# Patient Record
Sex: Female | Born: 1955 | Race: White | Hispanic: No | Marital: Married | Smoking: Never smoker
Health system: Southern US, Community
[De-identification: ages and names within clinical notes are randomized; demographics above are authoritative.]

## PROBLEM LIST (undated history)

## (undated) DIAGNOSIS — C9110 Chronic lymphocytic leukemia of B-cell type not having achieved remission: Secondary | ICD-10-CM

## (undated) DIAGNOSIS — Z9889 Other specified postprocedural states: Secondary | ICD-10-CM

## (undated) DIAGNOSIS — K56609 Unspecified intestinal obstruction, unspecified as to partial versus complete obstruction: Secondary | ICD-10-CM

## (undated) HISTORY — PX: ABDOMINAL SURGERY: SHX537

## (undated) HISTORY — PX: PORTA CATH INSERTION: CATH118285

---

## 2017-05-05 ENCOUNTER — Encounter (HOSPITAL_COMMUNITY): Payer: Self-pay | Admitting: Emergency Medicine

## 2017-05-05 ENCOUNTER — Inpatient Hospital Stay (HOSPITAL_COMMUNITY)
Admission: EM | Admit: 2017-05-05 | Discharge: 2017-05-11 | DRG: 389 | Disposition: A | Discharge status: Home / Self Care | Payer: Medicare Other | Attending: Family Medicine | Admitting: Family Medicine

## 2017-05-05 ENCOUNTER — Emergency Department (HOSPITAL_COMMUNITY): Payer: Medicare Other

## 2017-05-05 ENCOUNTER — Other Ambulatory Visit: Payer: Self-pay

## 2017-05-05 DIAGNOSIS — K565 Intestinal adhesions [bands], unspecified as to partial versus complete obstruction: Principal | ICD-10-CM | POA: Diagnosis present

## 2017-05-05 DIAGNOSIS — I10 Essential (primary) hypertension: Secondary | ICD-10-CM | POA: Diagnosis present

## 2017-05-05 DIAGNOSIS — R748 Abnormal levels of other serum enzymes: Secondary | ICD-10-CM | POA: Diagnosis present

## 2017-05-05 DIAGNOSIS — Z888 Allergy status to other drugs, medicaments and biological substances status: Secondary | ICD-10-CM | POA: Diagnosis not present

## 2017-05-05 DIAGNOSIS — Z936 Other artificial openings of urinary tract status: Secondary | ICD-10-CM

## 2017-05-05 DIAGNOSIS — Z880 Allergy status to penicillin: Secondary | ICD-10-CM | POA: Diagnosis not present

## 2017-05-05 DIAGNOSIS — N390 Urinary tract infection, site not specified: Secondary | ICD-10-CM | POA: Diagnosis present

## 2017-05-05 DIAGNOSIS — Z90710 Acquired absence of both cervix and uterus: Secondary | ICD-10-CM | POA: Diagnosis not present

## 2017-05-05 DIAGNOSIS — R109 Unspecified abdominal pain: Secondary | ICD-10-CM | POA: Diagnosis present

## 2017-05-05 DIAGNOSIS — K56609 Unspecified intestinal obstruction, unspecified as to partial versus complete obstruction: Secondary | ICD-10-CM | POA: Diagnosis not present

## 2017-05-05 DIAGNOSIS — Z9049 Acquired absence of other specified parts of digestive tract: Secondary | ICD-10-CM

## 2017-05-05 DIAGNOSIS — Z91040 Latex allergy status: Secondary | ICD-10-CM | POA: Diagnosis not present

## 2017-05-05 DIAGNOSIS — C9110 Chronic lymphocytic leukemia of B-cell type not having achieved remission: Secondary | ICD-10-CM | POA: Diagnosis present

## 2017-05-05 DIAGNOSIS — G35 Multiple sclerosis: Secondary | ICD-10-CM | POA: Diagnosis present

## 2017-05-05 DIAGNOSIS — D591 Other autoimmune hemolytic anemias: Secondary | ICD-10-CM | POA: Diagnosis present

## 2017-05-05 DIAGNOSIS — Z906 Acquired absence of other parts of urinary tract: Secondary | ICD-10-CM

## 2017-05-05 DIAGNOSIS — Z885 Allergy status to narcotic agent status: Secondary | ICD-10-CM

## 2017-05-05 DIAGNOSIS — C9190 Lymphoid leukemia, unspecified not having achieved remission: Secondary | ICD-10-CM | POA: Diagnosis not present

## 2017-05-05 HISTORY — DX: Other specified postprocedural states: Z98.890

## 2017-05-05 HISTORY — DX: Unspecified intestinal obstruction, unspecified as to partial versus complete obstruction: K56.609

## 2017-05-05 HISTORY — DX: Chronic lymphocytic leukemia of B-cell type not having achieved remission: C91.10

## 2017-05-05 LAB — URINALYSIS, ROUTINE W REFLEX MICROSCOPIC
BILIRUBIN URINE: NEGATIVE
Glucose, UA: NEGATIVE mg/dL
Hgb urine dipstick: NEGATIVE
KETONES UR: 20 mg/dL — AB
Nitrite: POSITIVE — AB
PROTEIN: 30 mg/dL — AB
Specific Gravity, Urine: 1.018 (ref 1.005–1.030)
pH: 7 (ref 5.0–8.0)

## 2017-05-05 LAB — COMPREHENSIVE METABOLIC PANEL
ALBUMIN: 3.5 g/dL (ref 3.5–5.0)
ALT: 15 U/L (ref 14–54)
ANION GAP: 7 (ref 5–15)
AST: 21 U/L (ref 15–41)
Alkaline Phosphatase: 62 U/L (ref 38–126)
BUN: 19 mg/dL (ref 6–20)
CHLORIDE: 104 mmol/L (ref 101–111)
CO2: 28 mmol/L (ref 22–32)
Calcium: 8.4 mg/dL — ABNORMAL LOW (ref 8.9–10.3)
Creatinine, Ser: 0.76 mg/dL (ref 0.44–1.00)
GFR calc Af Amer: >60 mL/min (ref >60)
GFR calc non Af Amer: >60 mL/min (ref >60)
GLUCOSE: 87 mg/dL (ref 65–99)
POTASSIUM: 3.9 mmol/L (ref 3.5–5.1)
SODIUM: 139 mmol/L (ref 135–145)
TOTAL PROTEIN: 6.2 g/dL — AB (ref 6.5–8.1)
Total Bilirubin: 1 mg/dL (ref 0.3–1.2)

## 2017-05-05 LAB — CBC
HEMATOCRIT: 39.7 % (ref 36.0–46.0)
HEMOGLOBIN: 14.2 g/dL (ref 12.0–15.0)
MCH: 32.5 pg (ref 26.0–34.0)
MCHC: 35.8 g/dL (ref 30.0–36.0)
MCV: 90.8 fL (ref 78.0–100.0)
Platelets: 279 10*3/uL (ref 150–400)
RBC: 4.37 MIL/uL (ref 3.87–5.11)
RDW: 15.5 % (ref 11.5–15.5)
WBC: 12.9 10*3/uL — ABNORMAL HIGH (ref 4.0–10.5)

## 2017-05-05 LAB — LIPASE, BLOOD: Lipase: 168 U/L — ABNORMAL HIGH (ref 11–51)

## 2017-05-05 MED ORDER — PROMETHAZINE HCL 25 MG/ML IJ SOLN
12.5000 mg | Freq: Once | INTRAMUSCULAR | Status: AC
  Administered 2017-05-05: 12.5 mg via INTRAVENOUS
  Filled 2017-05-05: qty 1

## 2017-05-05 MED ORDER — HYDROMORPHONE HCL 1 MG/ML IJ SOLN
1.0000 mg | Freq: Once | INTRAMUSCULAR | Status: AC
  Administered 2017-05-05: 1 mg via INTRAVENOUS
  Filled 2017-05-05: qty 1

## 2017-05-05 MED ORDER — ALBUTEROL SULFATE HFA 108 (90 BASE) MCG/ACT IN AERS: 2.0000 | INHALATION_SPRAY | Freq: Four times a day (QID) | RESPIRATORY_TRACT | Status: DC | PRN

## 2017-05-05 MED ORDER — KETOROLAC TROMETHAMINE 15 MG/ML IJ SOLN
15.0000 mg | Freq: Once | INTRAMUSCULAR | Status: AC
  Administered 2017-05-05: 15 mg via INTRAVENOUS
  Filled 2017-05-05: qty 1

## 2017-05-05 MED ORDER — ONDANSETRON HCL 4 MG/2ML IJ SOLN
4.0000 mg | Freq: Four times a day (QID) | INTRAMUSCULAR | Status: DC | PRN
  Administered 2017-05-05 – 2017-05-10 (×11): 4 mg via INTRAVENOUS
  Filled 2017-05-05 (×11): qty 2

## 2017-05-05 MED ORDER — CEFTRIAXONE SODIUM 1 G IJ SOLR
1.0000 g | INTRAMUSCULAR | Status: DC
  Administered 2017-05-05 – 2017-05-07 (×3): 1 g via INTRAVENOUS
  Filled 2017-05-05 (×4): qty 10

## 2017-05-05 MED ORDER — ONDANSETRON HCL 4 MG/2ML IJ SOLN
4.0000 mg | Freq: Once | INTRAMUSCULAR | Status: AC
  Administered 2017-05-05: 4 mg via INTRAVENOUS
  Filled 2017-05-05: qty 2

## 2017-05-05 MED ORDER — LACTATED RINGERS IV BOLUS (SEPSIS)
2000.0000 mL | Freq: Once | INTRAVENOUS | Status: AC
  Administered 2017-05-05: 2000 mL via INTRAVENOUS

## 2017-05-05 MED ORDER — IOPAMIDOL (ISOVUE-300) INJECTION 61%
INTRAVENOUS | Status: AC
Start: 2017-05-05 — End: 2017-05-06
  Filled 2017-05-05: qty 100

## 2017-05-05 MED ORDER — HYDROMORPHONE HCL 1 MG/ML IJ SOLN
1.0000 mg | INTRAMUSCULAR | Status: DC | PRN
  Administered 2017-05-06 – 2017-05-10 (×16): 1 mg via INTRAVENOUS
  Filled 2017-05-05 (×16): qty 1

## 2017-05-05 MED ORDER — SODIUM CHLORIDE 0.9 % IV SOLN
INTRAVENOUS | Status: DC
  Administered 2017-05-05 – 2017-05-11 (×11): via INTRAVENOUS

## 2017-05-05 MED ORDER — IOPAMIDOL (ISOVUE-300) INJECTION 61%
100.0000 mL | Freq: Once | INTRAVENOUS | Status: AC | PRN
  Administered 2017-05-05: 100 mL via INTRAVENOUS

## 2017-05-05 MED ORDER — ACETAMINOPHEN 500 MG PO TABS
1000.0000 mg | ORAL_TABLET | Freq: Three times a day (TID) | ORAL | Status: DC | PRN
  Administered 2017-05-10: 1000 mg via ORAL
  Filled 2017-05-05: qty 2

## 2017-05-05 MED ORDER — ENOXAPARIN SODIUM 40 MG/0.4ML ~~LOC~~ SOLN
40.0000 mg | SUBCUTANEOUS | Status: DC
  Administered 2017-05-05 – 2017-05-10 (×6): 40 mg via SUBCUTANEOUS
  Filled 2017-05-05 (×6): qty 0.4

## 2017-05-05 MED ORDER — ALBUTEROL SULFATE (2.5 MG/3ML) 0.083% IN NEBU: 2.5000 mg | INHALATION_SOLUTION | Freq: Four times a day (QID) | RESPIRATORY_TRACT | Status: DC | PRN

## 2017-05-05 NOTE — ED Notes (Signed)
Pt wants blood drawn from port.  

## 2017-05-05 NOTE — H&P (Addendum)
History and Physical    Sheila Petersen DDU:[id number] DOB: 1956/03/09 DOA: 05/05/2017  Referring MD/NP/PA: EDP PCP:  Patient coming from: visiting friends in [place]  Chief Complaint: Abd pain, N/Vomiting  HPI: Sheila Petersen is a 61 y.o. 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 In addition also reports right flank pain and discomfort completed a ten-day course of ciprofloxacin 2 days ago. -Due to severe persistent bilious vomiting, and known history of recurrent small bowel obstruction she presented to the emergency room for further evaluation. Labs unremarkable, urine from stoma positive for nitrites and leukocyte esterase .KUB notable for multiple dilated loops of small bowel,CCS consulted in CT abdomen pelvis recommended for further evaluation.  ED Course: Failed PO trial, Kub concerning for small bowel obstruction, CT abd resultspending  Review of Systems: As per HPI otherwise 14 point review of systems negative.   Past Medical History:  Diagnosis Date  . CLL (chronic lymphocytic leukemia) ([place])   . History of urostomy   . SBO (small bowel obstruction) (HCC)     Past Surgical History:  Procedure Laterality Date  . ABDOMINAL SURGERY    . PORTA CATH INSERTION       reports that  has never smoked. she has never used smokeless tobacco. She reports that she does not drink alcohol. Her drug history is not on file.  Allergies  Allergen Reactions  . Ketamine Hives and Other (See Comments)    hallucination    . Monosodium Glutamate Anaphylaxis  . Allopurinol Hives  . Epinephrine Other (See Comments)    Blood pressure drop.  .  Latex     anaphylaxis  . Metronidazole Other (See Comments)    Rectal bleeding, pelvic pain.   Marland Kitchen Morphine And Related     heartburn  . Penicillins Rash    Has patient had a PCN reaction causing immediate rash, facial/tongue/throat swelling, SOB or lightheadedness with hypotension: No Has patient had a PCN reaction causing severe rash involving mucus membranes or skin necrosis: No Has patient had a PCN reaction that required hospitalization: No Has patient had a PCN reaction occurring within the last 10 years: No If all of the above answers are "NO", then may proceed with Cephalosporin use.     family history. -reviewed, no history of inflammatory bowel disease or colon cancers   Prior to Admission medications   Medication Sig Start Date End Date Taking? Authorizing Provider  acetaminophen (TYLENOL) 500 MG tablet Take 1,000 mg by mouth every 8 (eight) hours as needed for mild pain.   Yes [provider]  albuterol (PROVENTIL HFA;VENTOLIN HFA) 108 (90 Base) MCG/ACT inhaler Inhale 2 puffs into the lungs every 6 (six) hours as needed.  07/31/15  Yes [provider]  amitriptyline (ELAVIL) 150 MG tablet Take 150 mg by mouth at bedtime. 06/26/13  Yes [provider]  amLODipine (NORVASC) 5 MG tablet Take 5 mg by mouth daily. 03/04/17  Yes [provider]  calcium carbonate (TUMS - DOSED IN MG ELEMENTAL CALCIUM) 500 MG chewable tablet Chew 1 tablet by mouth 3 (three) times daily with meals.   Yes [provider]  cetirizine (ZYRTEC) 10 MG tablet Take 10 mg by mouth at bedtime.   Yes  [provider]  ciprofloxacin (CIPRO) 500 MG tablet Take 500 mg by mouth 2 (two) times daily. 04/18/17  Yes [provider]  clobetasol ointment (TEMOVATE) 5.62 % Apply 1 application topically 2 (two) times daily. 03/04/17  Yes [provider]  cyanocobalamin (,VITAMIN B-12,) 1000 MCG/ML injection Inject 1 mL into the muscle every 30 (thirty) days.  03/04/17  Yes [provider]  diclofenac sodium (VOLTAREN) 1 % GEL APP 4 GRAMS QID MAX 16 GRAMS PER JOINT PER DAY UP TO 32 GRAMS TOTAL PER DAY 12/17/15  Yes [provider]  diphenhydrAMINE (BENADRYL) 25 MG tablet Take 50 mg by mouth at bedtime.   Yes [provider]  estradiol (VIVELLE-DOT) 0.1 MG/24HR patch Place 1 patch onto the skin 2 (two) times a week.    Yes [provider]  ibuprofen (ADVIL,MOTRIN) 200 MG tablet Take 800 mg by mouth every 6 (six) hours as needed for mild pain.   Yes [provider]  meloxicam (MOBIC) 15 MG tablet Take 15 mg by mouth daily. 04/24/17  Yes [provider]  PREMARIN vaginal cream APP PEA-SIZED AMOUNT TO VULVA IN THE MORNING 4 TIMES A WEEK. 03/04/17  Yes [provider]    Physical Exam: Vitals:   05/05/17 1130 05/05/17 1355 05/05/17 1619  BP: 126/87 119/71 111/62  Pulse: 87 84 75  Resp: 17 18 16   Temp: 98 F (36.7 C) 98 F (36.7 C)   TempSrc: Oral Oral   SpO2: 95% 100% 97%      Constitutional: NAD, calm, comfortable, no distress Vitals:   05/05/17 1130 05/05/17 1355 05/05/17 1619  BP: 126/87 119/71 111/62  Pulse: 87 84 75  Resp: 17 18 16   Temp: 98 F (36.7 C) 98 F (36.7 C)   TempSrc: Oral Oral   SpO2: 95% 100% 97%   Eyes: PERRL, lids and conjunctivae normal ENMT: Mucous membranes are moist.  Neck: normal, supple Respiratory: clear to auscultation bilaterally, no wheezing, no crackles. Normal respiratory effort. No accessory muscle use.  Cardiovascular: Regular rate and rhythm, no murmurs / rubs / gallops Abdomen: Soft, mildly distended, multiple abdominal scars with mild lower quadrant tenderness no rigidity or rebound noted, ileostomy/stoma with urine noted Musculoskeletal: No joint deformity upper and lower extremities. Ext: No edema Skin: no rashes, lesions, ulcers.  Neurologic: CN 2-12 grossly intact. Sensation intact, DTR normal. Strength 5/5 in all 4.  Psychiatric:  Normal judgment and insight. Alert and oriented x 3. Normal mood.   Labs on Admission: I have personally reviewed following labs and imaging studies  CBC: Recent Labs  Lab 05/05/17 1229  WBC 12.9*  HGB 14.2  HCT 39.7  MCV 90.8  PLT 563   Basic Metabolic Panel: Recent Labs  Lab 05/05/17 1430  NA 139  K 3.9  CL 104  CO2 28  GLUCOSE 87  BUN 19  CREATININE 0.76  CALCIUM 8.4*   GFR: CrCl cannot be calculated (Unknown ideal weight.). Liver Function Tests: Recent Labs  Lab 05/05/17 1430  AST 21  ALT 15  ALKPHOS 62  BILITOT 1.0  PROT 6.2*  ALBUMIN 3.5   Recent Labs  Lab 05/05/17 1430  LIPASE 168*   No results for input(s): AMMONIA in the last 168 hours. Coagulation Profile: No results for input(s): INR, PROTIME in the last 168 hours. Cardiac Enzymes: No results for input(s): CKTOTAL, CKMB, CKMBINDEX, TROPONINI in the last 168 hours. BNP (last 3 results) No results for input(s): PROBNP in the last 8760 hours. HbA1C:  No results for input(s): HGBA1C in the last 72 hours. CBG: No results for input(s): GLUCAP in the last 168 hours. Lipid Profile: No results for input(s): CHOL, HDL, LDLCALC, TRIG, CHOLHDL, LDLDIRECT in the last 72 hours. Thyroid Function Tests: No results for input(s): TSH, T4TOTAL, FREET4, T3FREE, THYROIDAB in the last 72 hours. Anemia Panel: No results for input(s): VITAMINB12, FOLATE, FERRITIN, TIBC, IRON, RETICCTPCT in the last 72 hours. Urine analysis:    Component Value Date/Time   COLORURINE YELLOW 05/05/2017 1250   APPEARANCEUR HAZY (A) 05/05/2017 1250   LABSPEC 1.018 05/05/2017 1250   PHURINE 7.0 05/05/2017 1250   GLUCOSEU NEGATIVE 05/05/2017 1250   HGBUR NEGATIVE 05/05/2017 1250   BILIRUBINUR NEGATIVE 05/05/2017 1250   KETONESUR 20 (A) 05/05/2017 1250   PROTEINUR 30 (A) 05/05/2017 1250   NITRITE POSITIVE (A) 05/05/2017 1250   LEUKOCYTESUR TRACE (A) 05/05/2017 1250   Sepsis Labs: @LABRCNTIP (procalcitonin:4,lacticidven:4) )No  results found for this or any previous visit (from the past 240 hour(s)).   Radiological Exams on Admission: Dg Abd Acute W/chest  Result Date: 05/05/2017 CLINICAL DATA:  Right-sided abdominal pain with nausea and vomiting. Check port placement. EXAM: DG ABDOMEN ACUTE W/ 1V CHEST COMPARISON:  None. FINDINGS: There is a single moderately dilated loop of small bowel containing an air-fluid level in the left abdomen. Paucity of remaining small bowel gas. Large colonic stool burden. Bowel anastomotic sutures are noted in the right lower quadrant. Multiple surgical clips in the pelvis. Degenerative disc disease at L4-L5. No acute osseous abnormality. Right chest wall port catheter with the tip at the cavoatrial junction. Heart size and mediastinal contours are within normal limits. Both lungs are clear. IMPRESSION: 1. Single moderately dilated loop of small bowel containing an air-fluid level in the left abdomen. While nonspecific, given the paucity of small bowel gas, findings could represent partial or early small bowel obstruction. 2.  Prominent stool throughout the colon favors constipation. 3. Appropriately positioned right chest wall port catheter. No active cardiopulmonary disease. Electronically Signed   By: Titus Dubin M.D.   On: 05/05/2017 12:23    EKG: Independently reviewed. NSR, no acute ST T wavechanges  Assessment/Plan Active Problems:    SBO (small bowel obstruction) (HCC) -await CT scan -CCS consultedper EDP, small bowel protocol, will likely need NG tube depending on severity of obstruction and recurrence of vomiting, pt resistant to this now while awaiting CT -Could have a partially treated UTI also contributing to some of her symptoms start IV ceftriaxone and monitor for response,  -NPO, bowel rest, IVF, supportive care    CLL (chronic lymphocytic leukemia) ([place]) -Follow-up with oncology at [facility]   H/o cold agglutinin disease -Hemoglobin stable and no evidence of  hemolysis noted on today's labs  Possible UTI/ pyelonephritis  -Discuss complete a ten-day course of ciprofloxacin  -Check urine culture , due to flank tenderness and some symptoms we'll empirically treat with ceftriaxone for now   HTN -hold amlodipine while NPO   DVT prophylaxis: lovenox Code Status: Full Code Family Communication: spouse at bedside Disposition Plan: home when improved Consults called: CCS by EDP Admission status: Inpatient   Domenic Polite MD [facility] Pager [phone]  If 7PM-7AM, please contact night-coverage [email or web address] Password [email or web address]  05/05/2017, 5:25 PM

## 2017-05-05 NOTE — ED Notes (Signed)
Patient has power port to right chest. Xray ordered to verify placement prior to accessing. MD aware and adding abdominal xrays.

## 2017-05-05 NOTE — ED Notes (Signed)
Pt has been sipping fluids, but states she is still nauseated.

## 2017-05-05 NOTE — ED Notes (Signed)
Gave patient ice water and emesis bag.

## 2017-05-05 NOTE — ED Triage Notes (Addendum)
Pt reports she began to have RLQ pain radiating to back since last night that has gotten better since then. However pt has a hx of recurrent SBOs, and feels like it is the same. Also has urostomy. Threw up multiple times last night.

## 2017-05-05 NOTE — ED Notes (Signed)
Fluids paused for recollect.

## 2017-05-05 NOTE — ED Notes (Signed)
ED TO INPATIENT HANDOFF REPORT  Name/Age/Gender Sheila Petersen 61 y.o. female  Code Status Code Status History    This patient does not have a recorded code status. Please follow your organizational policy for patients in this situation.      Home/SNF/Other Home  Chief Complaint vomiting,abd pain  Level of Care/Admitting Diagnosis ED Disposition    ED Disposition Condition Comment   Admit  Hospital Area: [facility] [[id number]]  Level of Care: Med-Surg [16]  Diagnosis: SBO (small bowel obstruction) ([place]) [[id number]]  Admitting Physician: [place], [place]  Attending Physician: Domenic Polite [[id number]]  Estimated length of stay: past midnight tomorrow  Certification:: I certify this patient will need inpatient services for at least 2 midnights  PT Class (Do Not Modify): Inpatient [101]  PT Acc Code (Do Not Modify): Private [1]       Medical History Past Medical History:  Diagnosis Date  . CLL (chronic lymphocytic leukemia) ([place])   . History of urostomy   . SBO (small bowel obstruction) (HCC)     Allergies Allergies  Allergen Reactions  . Ketamine Hives and Other (See Comments)    hallucination    . Monosodium Glutamate Anaphylaxis  . Allopurinol Hives  . Epinephrine Other (See Comments)    Blood pressure drop.  . Latex     anaphylaxis  . Metronidazole Other (See Comments)    Rectal bleeding, pelvic pain.   Marland Kitchen Morphine And Related     heartburn  . Penicillins Rash    Has patient had a PCN reaction causing immediate rash, facial/tongue/throat swelling, SOB or lightheadedness with hypotension: No Has patient had a PCN reaction causing severe rash involving mucus membranes or skin necrosis: No Has patient had a PCN reaction that required hospitalization: No Has patient had a PCN reaction occurring within the last 10 years: No If all of the above answers are "NO", then may proceed with Cephalosporin use.     IV  Location/Drains/Wounds Patient Lines/Drains/Airways Status   Active Line/Drains/Airways    Name:   Placement date:   Placement time:   Site:   Days:   Implanted Port 05/05/17 Right Chest   05/05/17    1230    Chest   less than 1          Labs/Imaging Results for orders placed or performed during the hospital encounter of 05/05/17 (from the past 48 hour(s))  CBC     Status: Abnormal   Collection Time: 05/05/17 12:29 PM  Result Value Ref Range   WBC 12.9 (H) 4.0 - 10.5 K/uL   RBC 4.37 3.87 - 5.11 MIL/uL   Hemoglobin 14.2 12.0 - 15.0 g/dL   HCT 39.7 36.0 - 46.0 %   MCV 90.8 78.0 - 100.0 fL   MCH 32.5 26.0 - 34.0 pg   MCHC 35.8 30.0 - 36.0 g/dL    Comment: CORRECTED FOR COLD AGGLUTININS   RDW 15.5 11.5 - 15.5 %   Platelets 279 150 - 400 K/uL    Comment: RESULT REPEATED AND VERIFIED SPECIMEN CHECKED FOR CLOTS PLATELET COUNT CONFIRMED BY SMEAR   Urinalysis, Routine w reflex microscopic     Status: Abnormal   Collection Time: 05/05/17 12:50 PM  Result Value Ref Range   Color, Urine YELLOW YELLOW   APPearance HAZY (A) CLEAR   Specific Gravity, Urine 1.018 1.005 - 1.030   pH 7.0 5.0 - 8.0   Glucose, UA NEGATIVE NEGATIVE mg/dL   Hgb urine dipstick NEGATIVE NEGATIVE  Bilirubin Urine NEGATIVE NEGATIVE   Ketones, ur 20 (A) NEGATIVE mg/dL   Protein, ur 30 (A) NEGATIVE mg/dL   Nitrite POSITIVE (A) NEGATIVE   Leukocytes, UA TRACE (A) NEGATIVE   RBC / HPF 6-30 0 - 5 RBC/hpf   WBC, UA 6-30 0 - 5 WBC/hpf   Bacteria, UA RARE (A) NONE SEEN   Squamous Epithelial / LPF 0-5 (A) NONE SEEN   Mucus PRESENT    Amorphous Crystal PRESENT    Ca Oxalate Crys, UA PRESENT   Comprehensive metabolic panel     Status: Abnormal   Collection Time: 05/05/17  2:30 PM  Result Value Ref Range   Sodium 139 135 - 145 mmol/L   Potassium 3.9 3.5 - 5.1 mmol/L   Chloride 104 101 - 111 mmol/L   CO2 28 22 - 32 mmol/L   Glucose, Bld 87 65 - 99 mg/dL   BUN 19 6 - 20 mg/dL   Creatinine, Ser 0.76 0.44 - 1.00  mg/dL   Calcium 8.4 (L) 8.9 - 10.3 mg/dL   Total Protein 6.2 (L) 6.5 - 8.1 g/dL   Albumin 3.5 3.5 - 5.0 g/dL   AST 21 15 - 41 U/L   ALT 15 14 - 54 U/L   Alkaline Phosphatase 62 38 - 126 U/L   Total Bilirubin 1.0 0.3 - 1.2 mg/dL   GFR calc non Af Amer >60 >60 mL/min   GFR calc Af Amer >60 >60 mL/min    Comment: (NOTE) The eGFR has been calculated using the CKD EPI equation. This calculation has not been validated in all clinical situations. eGFR's persistently <60 mL/min signify possible Chronic Kidney Disease.    Anion gap 7 5 - 15  Lipase, blood     Status: Abnormal   Collection Time: 05/05/17  2:30 PM  Result Value Ref Range   Lipase 168 (H) 11 - 51 U/L   Ct Abdomen Pelvis W Contrast  Result Date: 05/05/2017 CLINICAL DATA:  Abdominal distention. Nausea and vomiting. Bowel obstruction. EXAM: CT ABDOMEN AND PELVIS WITH CONTRAST TECHNIQUE: Multidetector CT imaging of the abdomen and pelvis was performed using the standard protocol following bolus administration of intravenous contrast. CONTRAST:  100 cc Isovue-300 intravenous COMPARISON:  Abdominal CT report from [facility] 01/19/2016. FINDINGS: Lower chest:  No acute finding Hepatobiliary: No focal liver abnormality.Cholecystectomy. Prominent dilatation of the common bile duct measuring up to 14 mm. No calcified choledocholithiasis or mass. Pancreas: Borderline distal pancreatic duct size at 3 mm. No inflammation or masslike finding. Spleen: Unremarkable. Adrenals/Urinary Tract: Negative adrenals. Mild pelviectasis. Gas is seen within the right lower ureter. Status post cystectomy with urinary conduit. Conduit is collapsed currently. Hypoenhancement in the lower pole left kidney is associated with volume loss and best attributed to scarring. No convincing pyelonephritis. Stomach/Bowel: There is dilated small bowel with fluid filling and fecalized material before an abrupt transition in the ventral abdomen just below the umbilicus. No hernia  or mass is seen at this level. Findings are likely postoperative adhesions. Enteroenterostomies are noted elsewhere. Vascular/Lymphatic: No acute vascular abnormality. Pelvic lymphadenectomy. No noted mass or adenopathy. Reproductive:Hysterectomy. Other: No ascites or pneumoperitoneum. Musculoskeletal: No acute abnormalities. Notable for advanced L4-5 and L2-3 disc degeneration with endplate sclerosis and irregularity. IMPRESSION: 1. High-grade small bowel obstruction with transition point in the ventral midline abdomen, likely adhesive disease. 2. Cystectomy with ileal conduit.  No unexpected finding. 3. Other areas of enteroenterostomy. 4. Cholecystectomy with dilated common bile duct (14 mm). Please correlate with liver  function tests. Electronically Signed   By: Monte Fantasia M.D.   On: 05/05/2017 17:39   Dg Abd Acute W/chest  Result Date: 05/05/2017 CLINICAL DATA:  Right-sided abdominal pain with nausea and vomiting. Check port placement. EXAM: DG ABDOMEN ACUTE W/ 1V CHEST COMPARISON:  None. FINDINGS: There is a single moderately dilated loop of small bowel containing an air-fluid level in the left abdomen. Paucity of remaining small bowel gas. Large colonic stool burden. Bowel anastomotic sutures are noted in the right lower quadrant. Multiple surgical clips in the pelvis. Degenerative disc disease at L4-L5. No acute osseous abnormality. Right chest wall port catheter with the tip at the cavoatrial junction. Heart size and mediastinal contours are within normal limits. Both lungs are clear. IMPRESSION: 1. Single moderately dilated loop of small bowel containing an air-fluid level in the left abdomen. While nonspecific, given the paucity of small bowel gas, findings could represent partial or early small bowel obstruction. 2.  Prominent stool throughout the colon favors constipation. 3. Appropriately positioned right chest wall port catheter. No active cardiopulmonary disease. Electronically Signed   By:  Titus Dubin M.D.   On: 05/05/2017 12:23    Pending Labs Unresulted Labs (From admission, onward)   Start     Ordered   05/05/17 1748  Culture, Urine  Once,   R     05/05/17 1747   Signed and Held  HIV antibody (Routine Testing)  Once,   R     Signed and Held   Signed and Held  CBC  (enoxaparin (LOVENOX)    CrCl >/= 30 ml/min)  Once,   R    Comments:  Baseline for enoxaparin therapy IF NOT ALREADY DRAWN.  Notify MD if PLT < 100 K.    Signed and Held   Signed and Held  Creatinine, serum  (enoxaparin (LOVENOX)    CrCl >/= 30 ml/min)  Once,   R    Comments:  Baseline for enoxaparin therapy IF NOT ALREADY DRAWN.    Signed and Held   Signed and Held  Creatinine, serum  (enoxaparin (LOVENOX)    CrCl >/= 30 ml/min)  Weekly,   R    Comments:  while on enoxaparin therapy    Signed and Held   Signed and Held  CBC  Tomorrow morning,   R     Signed and Held   Signed and Held  Comprehensive metabolic panel  Tomorrow morning,   R     Signed and Held      Vitals/Pain Today's Vitals   05/05/17 1355 05/05/17 1544 05/05/17 1619 05/05/17 1806  BP: 119/71  111/62 114/68  Pulse: 84  75 74  Resp: 18  16 16   Temp: 98 F (36.7 C)     TempSrc: Oral     SpO2: 100%  97% 97%  PainSc:  7       Isolation Precautions No active isolations  Medications Medications  cefTRIAXone (ROCEPHIN) 1 g in dextrose 5 % 50 mL IVPB (1 g Intravenous New Bag/Given 05/05/17 1757)  HYDROmorphone (DILAUDID) injection 1 mg (not administered)  ondansetron (ZOFRAN) injection 4 mg (4 mg Intravenous Given 05/05/17 1830)  lactated ringers bolus 2,000 mL (0 mLs Intravenous Stopped 05/05/17 1500)  ondansetron (ZOFRAN) injection 4 mg (4 mg Intravenous Given 05/05/17 1240)  HYDROmorphone (DILAUDID) injection 1 mg (1 mg Intravenous Given 05/05/17 1240)  ketorolac (TORADOL) 15 MG/ML injection 15 mg (15 mg Intravenous Given 05/05/17 1545)  iopamidol (ISOVUE-300) 61 % injection 100 mL (  100 mLs Intravenous Contrast Given  05/05/17 1717)    Mobility walks

## 2017-05-06 ENCOUNTER — Inpatient Hospital Stay (HOSPITAL_COMMUNITY): Payer: Medicare Other

## 2017-05-06 ENCOUNTER — Encounter (HOSPITAL_COMMUNITY): Payer: Self-pay | Admitting: Surgery

## 2017-05-06 DIAGNOSIS — R748 Abnormal levels of other serum enzymes: Secondary | ICD-10-CM | POA: Diagnosis not present

## 2017-05-06 DIAGNOSIS — N390 Urinary tract infection, site not specified: Secondary | ICD-10-CM | POA: Diagnosis present

## 2017-05-06 LAB — COMPREHENSIVE METABOLIC PANEL
ALT: 124 U/L — ABNORMAL HIGH (ref 14–54)
ANION GAP: 7 (ref 5–15)
AST: 252 U/L — ABNORMAL HIGH (ref 15–41)
Albumin: 3.2 g/dL — ABNORMAL LOW (ref 3.5–5.0)
Alkaline Phosphatase: 105 U/L (ref 38–126)
BILIRUBIN TOTAL: 1.1 mg/dL (ref 0.3–1.2)
BUN: 21 mg/dL — ABNORMAL HIGH (ref 6–20)
CO2: 28 mmol/L (ref 22–32)
Calcium: 8.2 mg/dL — ABNORMAL LOW (ref 8.9–10.3)
Chloride: 106 mmol/L (ref 101–111)
Creatinine, Ser: 0.79 mg/dL (ref 0.44–1.00)
GLUCOSE: 100 mg/dL — AB (ref 65–99)
POTASSIUM: 3.8 mmol/L (ref 3.5–5.1)
Sodium: 141 mmol/L (ref 135–145)
TOTAL PROTEIN: 5.5 g/dL — AB (ref 6.5–8.1)

## 2017-05-06 LAB — CBC
HEMATOCRIT: 33.8 % — AB (ref 36.0–46.0)
Hemoglobin: 11.6 g/dL — ABNORMAL LOW (ref 12.0–15.0)
MCH: 30.7 pg (ref 26.0–34.0)
MCHC: 34.3 g/dL (ref 30.0–36.0)
MCV: 89.4 fL (ref 78.0–100.0)
PLATELETS: 226 10*3/uL (ref 150–400)
RBC: 3.78 MIL/uL — ABNORMAL LOW (ref 3.87–5.11)
RDW: 13.8 % (ref 11.5–15.5)
WBC: 9.1 10*3/uL (ref 4.0–10.5)

## 2017-05-06 MED ORDER — AMITRIPTYLINE HCL 25 MG PO TABS
150.0000 mg | ORAL_TABLET | Freq: Every day | ORAL | Status: DC
  Administered 2017-05-06 – 2017-05-07 (×2): 150 mg via ORAL
  Filled 2017-05-06 (×3): qty 6

## 2017-05-06 MED ORDER — DIPHENHYDRAMINE HCL 25 MG PO CAPS
25.0000 mg | ORAL_CAPSULE | Freq: Every evening | ORAL | Status: DC | PRN
Start: 2017-05-06 — End: 2017-05-11

## 2017-05-06 NOTE — Progress Notes (Signed)
PROGRESS NOTE  Sheila Petersen LKG:[id number] DOB: Jan 30, 1956 DOA: 05/05/2017 PCP: System, Pcp Not In   LOS: 1 day   Brief Narrative / Interim history: 61 y.o. 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  Assessment & Plan: Active Problems:   SBO (small bowel obstruction) (HCC)   CLL (chronic lymphocytic leukemia) (HCC)   SBO (small bowel obstruction) (HCC) -Present on the CT scan, patient reports a long-standing history of recurrent small bowel obstructions requiring LOA in the past -No nausea or vomiting overnight, she wanted to hold on NG tube and will continue to hold, however still with abdominal tenderness.  He did not pass gas nor had a bowel movement overnight. -Consulted surgery this morning, appreciate input, continue n.p.o., bowel rest, IV fluids  Elevated liver enzymes -On admission her LFTs were normal, however AST 1 from 21 >> 252 today, and ALT went from 15 >> 124 -CT scan with no focal liver abnormality, status post cholecystectomy she did have CBD dilation up to 14 mm -monitor, she appears to have had a similar episode in January 2018 in the setting of an admission to sepsis and was thought to be in the setting of a viral illness.  She tested negative for hep B and C in the past.  CLL (chronic lymphocytic leukemia) ([facility]) -Follow-up with oncology at [facility], she has Rituxin in the past per chart review in care everywhere  H/o cold agglutinin disease -Hemoglobin stable and no evidence of hemolysis noted on today's labs, recheck hemoglobin  tomorrow, check LDH, bilirubin and haptoglobin  Possible UTI/ pyelonephritis  -Continue ceftriaxone, urine cultures are pending  HTN -hold amlodipine while NPO    DVT prophylaxis: Lovenox Code Status: Full code Family Communication: No family at bedside Disposition Plan: Home when ready  Consultants:   General surgery  Procedures:   None   Antimicrobials:  Ceftriaxone 11/22 >>   Subjective: -Denies any nausea or vomiting, abdomen still feels full and tender, has not had a bowel movement or passed any gas.  Objective: Vitals:   05/05/17 1619 05/05/17 1806 05/05/17 1915 05/06/17 0459  BP: 111/62 114/68 (!) 141/82 121/65  Pulse: 75 74 80 87  Resp: 16 16 17 16   Temp:   97.8 F (36.6 C) 98.3 F (36.8 C)  TempSrc:   Oral Oral  SpO2: 97% 97% 97% 93%  Weight:   74.8 kg (165 lb)   Height:   5\' 8"  (1.727 m)     Intake/Output Summary (Last 24 hours) at 05/06/2017 1042 Last data filed at 05/06/2017 0530 Gross per 24 hour  Intake 2953.33 ml  Output 250 ml  Net 2703.33 ml   Filed Weights   05/05/17 1915  Weight: 74.8 kg (165 lb)    Examination:  Constitutional: NAD Eyes: lids and conjunctivae normal ENMT: Mucous membranes are moist Neck: normal, supple Respiratory: clear to auscultation bilaterally, no wheezing, no crackles. Cardiovascular: Regular rate and rhythm, no murmurs / rubs / gallops.  Abdomen: Diffusely tender to palpation, voluntary guarding, absent bowel sounds.  Skin: no rashes, lesions, ulcers. No induration Neurologic: non focal    Data Reviewed: I have independently reviewed  following labs and imaging studies   CBC: Recent Labs  Lab 05/05/17 1229 05/06/17 0500  WBC 12.9* 9.1  HGB 14.2 11.6*  HCT 39.7 33.8*  MCV 90.8 89.4  PLT 279 381   Basic Metabolic Panel: Recent Labs  Lab 05/05/17 1430 05/06/17 0500  NA 139 141  K 3.9 3.8  CL 104 106  CO2 28 28  GLUCOSE 87 100*  BUN 19 21*  CREATININE 0.76 0.79  CALCIUM 8.4* 8.2*    GFR: Estimated Creatinine Clearance: 73.6 mL/min (by C-G formula based on SCr of 0.79 mg/dL). Liver Function Tests: Recent Labs  Lab 05/05/17 1430 05/06/17 0500  AST 21 252*  ALT 15 124*  ALKPHOS 62 105  BILITOT 1.0 1.1  PROT 6.2* 5.5*  ALBUMIN 3.5 3.2*   Recent Labs  Lab 05/05/17 1430  LIPASE 168*   No results for input(s): AMMONIA in the last 168 hours. Coagulation Profile: No results for input(s): INR, PROTIME in the last 168 hours. Cardiac Enzymes: No results for input(s): CKTOTAL, CKMB, CKMBINDEX, TROPONINI in the last 168 hours. BNP (last 3 results) No results for input(s): PROBNP in the last 8760 hours. HbA1C: No results for input(s): HGBA1C in the last 72 hours. CBG: No results for input(s): GLUCAP in the last 168 hours. Lipid Profile: No results for input(s): CHOL, HDL, LDLCALC, TRIG, CHOLHDL, LDLDIRECT in the last 72 hours. Thyroid Function Tests: No results for input(s): TSH, T4TOTAL, FREET4, T3FREE, THYROIDAB in the last 72 hours. Anemia Panel: No results for input(s): VITAMINB12, FOLATE, FERRITIN, TIBC, IRON, RETICCTPCT in the last 72 hours. Urine analysis:    Component Value Date/Time   COLORURINE YELLOW 05/05/2017 1250   APPEARANCEUR HAZY (A) 05/05/2017 1250   LABSPEC 1.018 05/05/2017 1250   PHURINE 7.0 05/05/2017 1250   GLUCOSEU NEGATIVE 05/05/2017 1250   HGBUR NEGATIVE 05/05/2017 1250   BILIRUBINUR NEGATIVE 05/05/2017 1250   KETONESUR 20 (A) 05/05/2017 1250   PROTEINUR 30 (A) 05/05/2017 1250   NITRITE POSITIVE (A) 05/05/2017 1250   LEUKOCYTESUR TRACE (A) 05/05/2017 1250   Sepsis Labs: Invalid input(s): PROCALCITONIN, LACTICIDVEN  No results found for this or any previous visit (from the past 240 hour(s)).    Radiology Studies: Dg Abd 1 View  Result Date: 05/06/2017 CLINICAL DATA:  Follow up small bowel obstruction EXAM: ABDOMEN - 1 VIEW COMPARISON:  05/05/2017 FINDINGS: Scattered large and small bowel gas is noted. A right lower  quadrant ostomy is noted. Contrast material is noted within the collecting systems from recent CT. A single loop of prominent small bowel is noted in the mid abdomen. No definitive free air is noted. Degenerative changes of the lumbar spine are noted. IMPRESSION: Single dilated loop of small bowel in the mid abdomen. The overall appearance is improved slightly from the prior exam. Correlation with the physical exam is recommended. Electronically Signed   By: Inez Catalina M.D.   On: 05/06/2017 07:27   Ct Abdomen Pelvis W Contrast  Result Date: 05/05/2017 CLINICAL DATA:  Abdominal distention. Nausea and vomiting. Bowel obstruction. EXAM: CT ABDOMEN AND PELVIS WITH CONTRAST TECHNIQUE: Multidetector CT imaging of the abdomen and pelvis was performed using the standard protocol following bolus administration of intravenous contrast. CONTRAST:  100 cc Isovue-300 intravenous COMPARISON:  Abdominal CT report from [facility] 01/19/2016. FINDINGS: Lower chest:  No acute finding Hepatobiliary: No focal liver abnormality.Cholecystectomy. Prominent dilatation of the common bile duct measuring up to 14 mm. No calcified choledocholithiasis or mass. Pancreas: Borderline distal pancreatic duct size at  3 mm. No inflammation or masslike finding. Spleen: Unremarkable. Adrenals/Urinary Tract: Negative adrenals. Mild pelviectasis. Gas is seen within the right lower ureter. Status post cystectomy with urinary conduit. Conduit is collapsed currently. Hypoenhancement in the lower pole left kidney is associated with volume loss and best attributed to scarring. No convincing pyelonephritis. Stomach/Bowel: There is dilated small bowel with fluid filling and fecalized material before an abrupt transition in the ventral abdomen just below the umbilicus. No hernia or mass is seen at this level. Findings are likely postoperative adhesions. Enteroenterostomies are noted elsewhere. Vascular/Lymphatic: No acute vascular abnormality. Pelvic  lymphadenectomy. No noted mass or adenopathy. Reproductive:Hysterectomy. Other: No ascites or pneumoperitoneum. Musculoskeletal: No acute abnormalities. Notable for advanced L4-5 and L2-3 disc degeneration with endplate sclerosis and irregularity. IMPRESSION: 1. High-grade small bowel obstruction with transition point in the ventral midline abdomen, likely adhesive disease. 2. Cystectomy with ileal conduit.  No unexpected finding. 3. Other areas of enteroenterostomy. 4. Cholecystectomy with dilated common bile duct (14 mm). Please correlate with liver function tests. Electronically Signed   By: Monte Fantasia M.D.   On: 05/05/2017 17:39   Dg Abd Acute W/chest  Result Date: 05/05/2017 CLINICAL DATA:  Right-sided abdominal pain with nausea and vomiting. Check port placement. EXAM: DG ABDOMEN ACUTE W/ 1V CHEST COMPARISON:  None. FINDINGS: There is a single moderately dilated loop of small bowel containing an air-fluid level in the left abdomen. Paucity of remaining small bowel gas. Large colonic stool burden. Bowel anastomotic sutures are noted in the right lower quadrant. Multiple surgical clips in the pelvis. Degenerative disc disease at L4-L5. No acute osseous abnormality. Right chest wall port catheter with the tip at the cavoatrial junction. Heart size and mediastinal contours are within normal limits. Both lungs are clear. IMPRESSION: 1. Single moderately dilated loop of small bowel containing an air-fluid level in the left abdomen. While nonspecific, given the paucity of small bowel gas, findings could represent partial or early small bowel obstruction. 2.  Prominent stool throughout the colon favors constipation. 3. Appropriately positioned right chest wall port catheter. No active cardiopulmonary disease. Electronically Signed   By: Titus Dubin M.D.   On: 05/05/2017 12:23     Scheduled Meds: . enoxaparin (LOVENOX) injection  40 mg Subcutaneous Q24H   Continuous Infusions: . sodium chloride  100 mL/hr at 05/06/17 0531  . cefTRIAXone (ROCEPHIN)  IV Stopped (05/05/17 1827)    Marzetta Board, MD, PhD [facility] Pager [phone]  If 7PM-7AM, please contact night-coverage [email or web address] Password [facility] 05/06/2017, 10:42 AM

## 2017-05-06 NOTE — Consult Note (Signed)
General Surgery [facility]  Reason for Consult: small bowel obstruction, abdominal pain  Referring Physician: Dr. Marzetta Board, [facility]  Sheila Petersen is an 61 y.o. female.  HPI: patient is a 61 yo WF admitted to the medical service with 5 day hx of worsening abdominal pain, and 48 hour hx of nausea and emesis.  Evaluation included AXR and CT abd demonstrating small bowel obstruction with transition point in mid small bowel in mid abdomen.  Complex past medical and surgical history.  Hx of interstitial cystitis with cystectomy and ileal conduit in 1990.  Multiple procedures at multiple medical centers around the country since that time.  Multiple episodes of small bowel obstruction, some managed out-patient, some in-patient, and some operatively.  Last surgery performed at [facility] in 2011.  CTA shows multiple enteroenterostomies.  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  Husband at bedside.  Recently relocated to [place], area.  Visiting [place] for Thanksgiving.  Past Medical History:  Diagnosis Date  . CLL (chronic lymphocytic leukemia) ([place])   . History of urostomy   . SBO (small bowel obstruction) (HCC)     Past Surgical History:  Procedure Laterality Date  . ABDOMINAL SURGERY    . PORTA CATH INSERTION      History reviewed. No pertinent family history.  Social History:  reports that  has never smoked. she has never used smokeless tobacco. She reports that she does not drink alcohol. Her drug history is not on file.  Allergies:  Allergies  Allergen Reactions  . Ketamine Hives and Other (See Comments)    hallucination    . Monosodium Glutamate Anaphylaxis  . Allopurinol Hives  . Epinephrine Other (See Comments)    Blood pressure drop.  . Latex     anaphylaxis  . Metronidazole Other (See Comments)    Rectal bleeding, pelvic pain.   Marland Kitchen Morphine And Related     heartburn  .  Penicillins Rash    Has patient had a PCN reaction causing immediate rash, facial/tongue/throat swelling, SOB or lightheadedness with hypotension: No Has patient had a PCN reaction causing severe rash involving mucus membranes or skin necrosis: No Has patient had a PCN reaction that required hospitalization: No Has patient had a PCN reaction occurring within the last 10 years: No If all of the above answers are "NO", then may proceed with Cephalosporin use.     Medications: I have reviewed the patient's current medications.  Results for orders placed or performed during the hospital encounter of 05/05/17 (from the past 48 hour(s))  CBC     Status: Abnormal   Collection Time: 05/05/17 12:29 PM  Result Value Ref Range   WBC 12.9 (H) 4.0 - 10.5 K/uL   RBC 4.37 3.87 - 5.11 MIL/uL   Hemoglobin 14.2 12.0 - 15.0 g/dL   HCT 39.7 36.0 - 46.0 %   MCV 90.8 78.0 - 100.0 fL   MCH 32.5 26.0 - 34.0 pg   MCHC 35.8 30.0 - 36.0 g/dL    Comment: CORRECTED FOR COLD AGGLUTININS   RDW 15.5 11.5 - 15.5 %   Platelets 279 150 - 400 K/uL    Comment: RESULT REPEATED AND VERIFIED SPECIMEN CHECKED FOR CLOTS PLATELET COUNT CONFIRMED BY SMEAR   Urinalysis, Routine w reflex microscopic     Status: Abnormal   Collection Time: 05/05/17 12:50 PM  Result Value Ref Range   Color, Urine YELLOW YELLOW   APPearance  HAZY (A) CLEAR   Specific Gravity, Urine 1.018 1.005 - 1.030   pH 7.0 5.0 - 8.0   Glucose, UA NEGATIVE NEGATIVE mg/dL   Hgb urine dipstick NEGATIVE NEGATIVE   Bilirubin Urine NEGATIVE NEGATIVE   Ketones, ur 20 (A) NEGATIVE mg/dL   Protein, ur 30 (A) NEGATIVE mg/dL   Nitrite POSITIVE (A) NEGATIVE   Leukocytes, UA TRACE (A) NEGATIVE   RBC / HPF 6-30 0 - 5 RBC/hpf   WBC, UA 6-30 0 - 5 WBC/hpf   Bacteria, UA RARE (A) NONE SEEN   Squamous Epithelial / LPF 0-5 (A) NONE SEEN   Mucus PRESENT    Amorphous Crystal PRESENT    Ca Oxalate Crys, UA PRESENT   Comprehensive metabolic panel     Status: Abnormal    Collection Time: 05/05/17  2:30 PM  Result Value Ref Range   Sodium 139 135 - 145 mmol/L   Potassium 3.9 3.5 - 5.1 mmol/L   Chloride 104 101 - 111 mmol/L   CO2 28 22 - 32 mmol/L   Glucose, Bld 87 65 - 99 mg/dL   BUN 19 6 - 20 mg/dL   Creatinine, Ser 0.76 0.44 - 1.00 mg/dL   Calcium 8.4 (L) 8.9 - 10.3 mg/dL   Total Protein 6.2 (L) 6.5 - 8.1 g/dL   Albumin 3.5 3.5 - 5.0 g/dL   AST 21 15 - 41 U/L   ALT 15 14 - 54 U/L   Alkaline Phosphatase 62 38 - 126 U/L   Total Bilirubin 1.0 0.3 - 1.2 mg/dL   GFR calc non Af Amer >60 >60 mL/min   GFR calc Af Amer >60 >60 mL/min    Comment: (NOTE) The eGFR has been calculated using the CKD EPI equation. This calculation has not been validated in all clinical situations. eGFR's persistently <60 mL/min signify possible Chronic Kidney Disease.    Anion gap 7 5 - 15  Lipase, blood     Status: Abnormal   Collection Time: 05/05/17  2:30 PM  Result Value Ref Range   Lipase 168 (H) 11 - 51 U/L  CBC     Status: Abnormal   Collection Time: 05/06/17  5:00 AM  Result Value Ref Range   WBC 9.1 4.0 - 10.5 K/uL   RBC 3.78 (L) 3.87 - 5.11 MIL/uL   Hemoglobin 11.6 (L) 12.0 - 15.0 g/dL   HCT 33.8 (L) 36.0 - 46.0 %   MCV 89.4 78.0 - 100.0 fL   MCH 30.7 26.0 - 34.0 pg   MCHC 34.3 30.0 - 36.0 g/dL   RDW 13.8 11.5 - 15.5 %   Platelets 226 150 - 400 K/uL  Comprehensive metabolic panel     Status: Abnormal   Collection Time: 05/06/17  5:00 AM  Result Value Ref Range   Sodium 141 135 - 145 mmol/L   Potassium 3.8 3.5 - 5.1 mmol/L   Chloride 106 101 - 111 mmol/L   CO2 28 22 - 32 mmol/L   Glucose, Bld 100 (H) 65 - 99 mg/dL   BUN 21 (H) 6 - 20 mg/dL   Creatinine, Ser 0.79 0.44 - 1.00 mg/dL   Calcium 8.2 (L) 8.9 - 10.3 mg/dL   Total Protein 5.5 (L) 6.5 - 8.1 g/dL   Albumin 3.2 (L) 3.5 - 5.0 g/dL   AST 252 (H) 15 - 41 U/L   ALT 124 (H) 14 - 54 U/L   Alkaline Phosphatase 105 38 - 126 U/L   Total Bilirubin 1.1 0.3 -  1.2 mg/dL   GFR calc non Af Amer >60 >60  mL/min   GFR calc Af Amer >60 >60 mL/min    Comment: (NOTE) The eGFR has been calculated using the CKD EPI equation. This calculation has not been validated in all clinical situations. eGFR's persistently <60 mL/min signify possible Chronic Kidney Disease.    Anion gap 7 5 - 15    Dg Abd 1 View  Result Date: 05/06/2017 CLINICAL DATA:  Follow up small bowel obstruction EXAM: ABDOMEN - 1 VIEW COMPARISON:  05/05/2017 FINDINGS: Scattered large and small bowel gas is noted. A right lower quadrant ostomy is noted. Contrast material is noted within the collecting systems from recent CT. A single loop of prominent small bowel is noted in the mid abdomen. No definitive free air is noted. Degenerative changes of the lumbar spine are noted. IMPRESSION: Single dilated loop of small bowel in the mid abdomen. The overall appearance is improved slightly from the prior exam. Correlation with the physical exam is recommended. Electronically Signed   By: Inez Catalina M.D.   On: 05/06/2017 07:27   Ct Abdomen Pelvis W Contrast  Result Date: 05/05/2017 CLINICAL DATA:  Abdominal distention. Nausea and vomiting. Bowel obstruction. EXAM: CT ABDOMEN AND PELVIS WITH CONTRAST TECHNIQUE: Multidetector CT imaging of the abdomen and pelvis was performed using the standard protocol following bolus administration of intravenous contrast. CONTRAST:  100 cc Isovue-300 intravenous COMPARISON:  Abdominal CT report from [facility] 01/19/2016. FINDINGS: Lower chest:  No acute finding Hepatobiliary: No focal liver abnormality.Cholecystectomy. Prominent dilatation of the common bile duct measuring up to 14 mm. No calcified choledocholithiasis or mass. Pancreas: Borderline distal pancreatic duct size at 3 mm. No inflammation or masslike finding. Spleen: Unremarkable. Adrenals/Urinary Tract: Negative adrenals. Mild pelviectasis. Gas is seen within the right lower ureter. Status post cystectomy with urinary conduit. Conduit is collapsed  currently. Hypoenhancement in the lower pole left kidney is associated with volume loss and best attributed to scarring. No convincing pyelonephritis. Stomach/Bowel: There is dilated small bowel with fluid filling and fecalized material before an abrupt transition in the ventral abdomen just below the umbilicus. No hernia or mass is seen at this level. Findings are likely postoperative adhesions. Enteroenterostomies are noted elsewhere. Vascular/Lymphatic: No acute vascular abnormality. Pelvic lymphadenectomy. No noted mass or adenopathy. Reproductive:Hysterectomy. Other: No ascites or pneumoperitoneum. Musculoskeletal: No acute abnormalities. Notable for advanced L4-5 and L2-3 disc degeneration with endplate sclerosis and irregularity. IMPRESSION: 1. High-grade small bowel obstruction with transition point in the ventral midline abdomen, likely adhesive disease. 2. Cystectomy with ileal conduit.  No unexpected finding. 3. Other areas of enteroenterostomy. 4. Cholecystectomy with dilated common bile duct (14 mm). Please correlate with liver function tests. Electronically Signed   By: Monte Fantasia M.D.   On: 05/05/2017 17:39   Dg Abd Acute W/chest  Result Date: 05/05/2017 CLINICAL DATA:  Right-sided abdominal pain with nausea and vomiting. Check port placement. EXAM: DG ABDOMEN ACUTE W/ 1V CHEST COMPARISON:  None. FINDINGS: There is a single moderately dilated loop of small bowel containing an air-fluid level in the left abdomen. Paucity of remaining small bowel gas. Large colonic stool burden. Bowel anastomotic sutures are noted in the right lower quadrant. Multiple surgical clips in the pelvis. Degenerative disc disease at L4-L5. No acute osseous abnormality. Right chest wall port catheter with the tip at the cavoatrial junction. Heart size and mediastinal contours are within normal limits. Both lungs are clear. IMPRESSION: 1. Single moderately dilated loop of small bowel  containing an air-fluid level in  the left abdomen. While nonspecific, given the paucity of small bowel gas, findings could represent partial or early small bowel obstruction. 2.  Prominent stool throughout the colon favors constipation. 3. Appropriately positioned right chest wall port catheter. No active cardiopulmonary disease. Electronically Signed   By: Titus Dubin M.D.   On: 05/05/2017 12:23    Review of Systems  Constitutional: Negative.   HENT: Negative.   Eyes: Negative.   Respiratory: Negative.   Cardiovascular: Negative.   Gastrointestinal: Positive for abdominal pain, nausea and vomiting.  Genitourinary: Negative.   Musculoskeletal: Negative.   Skin: Negative.   Neurological: Negative.   Endo/Heme/Allergies: Negative.   Psychiatric/Behavioral: Negative.    Blood pressure 121/65, pulse 87, temperature 98.3 F (36.8 C), temperature source Oral, resp. rate 16, height 5' 8"  (1.727 m), weight 74.8 kg (165 lb), SpO2 93 %. Physical Exam  Constitutional: She is oriented to person, place, and time. She appears well-developed and well-nourished. No distress.  HENT:  Head: Normocephalic and atraumatic.  Right Ear: External ear normal.  Left Ear: External ear normal.  Mouth/Throat: No oropharyngeal exudate.  Eyes: Conjunctivae are normal. Pupils are equal, round, and reactive to light. Right eye exhibits no discharge. Left eye exhibits no discharge. No scleral icterus.  Neck: Normal range of motion. Neck supple. No tracheal deviation present. No thyromegaly present.  Cardiovascular: Normal rate, regular rhythm and normal heart sounds.  Respiratory: Effort normal and breath sounds normal. No respiratory distress. She has no wheezes.  Port a cath right upper chest wall  GI: Bowel sounds are normal. She exhibits distension. She exhibits no mass. There is tenderness (diffuse). There is guarding (voluntary). There is no rebound.  Multiple surgical incisions, midline and right subcostal.  Ileal conduit RLQ.  No sign of  hernia.  Musculoskeletal: Normal range of motion. She exhibits no edema or deformity.  Neurological: She is alert and oriented to person, place, and time.  Skin: Skin is warm and dry. She is not diaphoretic.  Psychiatric: She has a normal mood and affect. Her behavior is normal.    Assessment/Plan: 1. Small bowel obstruction, likely secondary to adhesions  NPO, IV hydration  No NG tube at present - patient does not want due to "reactive airway"  Encouraged ambulation, OOB 2. Elevated LFT's  Hx of open cholecystectomy  Medical team to evaluate and monitor 3. Urinary tract infection  Received Rocephin in ER  Per medical service  Complex patient with recurrent small bowel obstruction at high risk of complications and co-morbidities with any operative intervention.  Patient would like to avoid surgery if at all possible.  States that most of her past obstructions have resolved without surgery.  Will follow closely.  At this time there is no evidence of bowel compromise.  If recurrent nausea and emesis or further distension, may need NG decompression.  Encouraged OOB and ambulation.  Will repeat AXR in AM 11/24.  Armandina Gemma, [place] [facility] Office: [place] 05/06/2017, 11:20 AM

## 2017-05-06 NOTE — ED Provider Notes (Signed)
[place] [facility] Provider Note   CSN: [id number] Arrival date & time: 05/05/17  1058     History   Chief Complaint Chief Complaint  Patient presents with  . Abdominal Pain    HPI Sheila Petersen is a 61 y.o. female.  HPI  Patient with history of CLL, urostomy, multiple small bowel obstructions comes in with chief complaint of abdominal pain, nausea, vomiting. Patient started having some right lower quadrant radiating to the back last night.  Initially patient was able to tolerate the pain, however over time the pain has returned and is severe.  Patient also started having emesis which has now turned bilious.  Patient has history of multiple small bowel obstructions, and suspects that she has a partial small bowel obstruction right now.  She is passing flatus, and her last bowel movement was earlier today.   Past Medical History:  Diagnosis Date  . CLL (chronic lymphocytic leukemia) ([place])   . History of urostomy   . SBO (small bowel obstruction) [facility])     Patient Active Problem List   Diagnosis Date Noted  . SBO (small bowel obstruction) ([place]) 05/05/2017  . CLL (chronic lymphocytic leukemia) ([place]) 05/05/2017    Past Surgical History:  Procedure Laterality Date  . ABDOMINAL SURGERY    . PORTA CATH INSERTION      OB History    No data available       Home Medications    Prior to Admission medications   Medication Sig Start Date End Date Taking? Authorizing Provider  acetaminophen (TYLENOL) 500 MG tablet Take 1,000 mg by mouth every 8 (eight) hours as needed for mild pain.   Yes [provider]  albuterol (PROVENTIL HFA;VENTOLIN HFA) 108 (90 Base) MCG/ACT inhaler Inhale 2 puffs into the lungs every 6 (six) hours as needed.  07/31/15  Yes [provider]  amitriptyline (ELAVIL) 150 MG tablet Take 150 mg by mouth at bedtime. 06/26/13  Yes [provider]  amLODipine (NORVASC) 5 MG tablet Take 5 mg by mouth daily.  03/04/17  Yes [provider]  calcium carbonate (TUMS - DOSED IN MG ELEMENTAL CALCIUM) 500 MG chewable tablet Chew 1 tablet by mouth 3 (three) times daily with meals.   Yes [provider]  cetirizine (ZYRTEC) 10 MG tablet Take 10 mg by mouth at bedtime.   Yes [provider]  ciprofloxacin (CIPRO) 500 MG tablet Take 500 mg by mouth 2 (two) times daily. 04/18/17  Yes [provider]  clobetasol ointment (TEMOVATE) 9.76 % Apply 1 application topically 2 (two) times daily. 03/04/17  Yes [provider]  cyanocobalamin (,VITAMIN B-12,) 1000 MCG/ML injection Inject 1 mL into the muscle every 30 (thirty) days. 03/04/17  Yes [provider]  diclofenac sodium (VOLTAREN) 1 % GEL APP 4 GRAMS QID MAX 16 GRAMS PER JOINT PER DAY UP TO 32 GRAMS TOTAL PER DAY 12/17/15  Yes [provider]  diphenhydrAMINE (BENADRYL) 25 MG tablet Take 50 mg by mouth at bedtime.   Yes [provider]  estradiol (VIVELLE-DOT) 0.1 MG/24HR patch Place 1 patch onto the skin 2 (two) times a week.    Yes [provider]  ibuprofen (ADVIL,MOTRIN) 200 MG tablet Take 800 mg by mouth every 6 (six) hours as needed for mild pain.   Yes [provider]  meloxicam (MOBIC) 15 MG tablet Take 15 mg by mouth daily. 04/24/17  Yes [provider]  PREMARIN vaginal cream APP PEA-SIZED AMOUNT TO  VULVA IN THE MORNING 4 TIMES A WEEK. 03/04/17  Yes [provider]    Family History History reviewed. No pertinent family history.  Social History Social History   Tobacco Use  . Smoking status: Never Smoker  . Smokeless tobacco: Never Used  Substance Use Topics  . Alcohol use: No    Frequency: Never  . Drug use: Not on file     Allergies   Ketamine; Monosodium glutamate; Allopurinol; Epinephrine; Latex; Metronidazole; Morphine and related; and Penicillins   Review of Systems Review of Systems  Constitutional: Positive for activity change.    Respiratory: Negative for shortness of breath.   Cardiovascular: Negative for chest pain.  Gastrointestinal: Positive for abdominal distention, nausea and vomiting.  Hematological: Does not bruise/bleed easily.  All other systems reviewed and are negative.    Physical Exam Updated Vital Signs BP 121/65 (BP Location: Left Arm)   Pulse 87   Temp 98.3 F (36.8 C) (Oral)   Resp 16   Ht 5\' 8"  (1.727 m)   Wt 74.8 kg (165 lb)   SpO2 93%   BMI 25.09 kg/m   Physical Exam  Constitutional: She is oriented to person, place, and time. She appears well-developed.  HENT:  Head: Normocephalic and atraumatic.  Eyes: EOM are normal.  Neck: Normal range of motion. Neck supple.  Cardiovascular: Normal rate.  Pulmonary/Chest: Effort normal.  Abdominal: She exhibits distension. Bowel sounds are decreased. There is generalized tenderness. There is guarding. There is no rebound.  Neurological: She is alert and oriented to person, place, and time.  Skin: Skin is warm and dry.  Nursing note and vitals reviewed.    ED Treatments / Results  Labs (all labs ordered are listed, but only abnormal results are displayed) Labs Reviewed  CBC - Abnormal; Notable for the following components:      Result Value   WBC 12.9 (*)    All other components within normal limits  URINALYSIS, ROUTINE W REFLEX MICROSCOPIC - Abnormal; Notable for the following components:   APPearance HAZY (*)    Ketones, ur 20 (*)    Protein, ur 30 (*)    Nitrite POSITIVE (*)    Leukocytes, UA TRACE (*)    Bacteria, UA RARE (*)    Squamous Epithelial / LPF 0-5 (*)    All other components within normal limits  COMPREHENSIVE METABOLIC PANEL - Abnormal; Notable for the following components:   Calcium 8.4 (*)    Total Protein 6.2 (*)    All other components within normal limits  LIPASE, BLOOD - Abnormal; Notable for the following components:   Lipase 168 (*)    All other components within normal limits  CBC - Abnormal;  Notable for the following components:   RBC 3.78 (*)    Hemoglobin 11.6 (*)    HCT 33.8 (*)    All other components within normal limits  COMPREHENSIVE METABOLIC PANEL - Abnormal; Notable for the following components:   Glucose, Bld 100 (*)    BUN 21 (*)    Calcium 8.2 (*)    Total Protein 5.5 (*)    Albumin 3.2 (*)    AST 252 (*)    ALT 124 (*)    All other components within normal limits  URINE CULTURE  HIV ANTIBODY (ROUTINE TESTING)    EKG  EKG Interpretation None       Radiology Dg Abd 1 View  Result Date: 05/06/2017 CLINICAL DATA:  Follow up small bowel obstruction EXAM: ABDOMEN -  1 VIEW COMPARISON:  05/05/2017 FINDINGS: Scattered large and small bowel gas is noted. A right lower quadrant ostomy is noted. Contrast material is noted within the collecting systems from recent CT. A single loop of prominent small bowel is noted in the mid abdomen. No definitive free air is noted. Degenerative changes of the lumbar spine are noted. IMPRESSION: Single dilated loop of small bowel in the mid abdomen. The overall appearance is improved slightly from the prior exam. Correlation with the physical exam is recommended. Electronically Signed   By: Inez Catalina M.D.   On: 05/06/2017 07:27   Ct Abdomen Pelvis W Contrast  Result Date: 05/05/2017 CLINICAL DATA:  Abdominal distention. Nausea and vomiting. Bowel obstruction. EXAM: CT ABDOMEN AND PELVIS WITH CONTRAST TECHNIQUE: Multidetector CT imaging of the abdomen and pelvis was performed using the standard protocol following bolus administration of intravenous contrast. CONTRAST:  100 cc Isovue-300 intravenous COMPARISON:  [facility]dominal [facility] report [facility] 01/19/2016. FINDINGS: Lower chest:  No acute finding Hepatobiliary: No focal liver abnormality.Cholecystectomy. Prominent dilatation of the common bile duct measuring up to 14 mm. No calcified choledocholithiasis or mass. Pancreas: Borderline distal pancreatic duct size at 3 mm. No  inflammation or masslike finding. Spleen: Unremarkable. Adrenals/Urinary Tract: Negative adrenals. Mild pelviectasis. Gas is seen within the right lower ureter. Status post cystectomy with urinary conduit. Conduit is collapsed currently. Hypoenhancement in the lower pole left kidney is associated with volume loss and best attributed to scarring. No convincing pyelonephritis. Stomach/Bowel: There is dilated small bowel with fluid filling and fecalized material before an abrupt transition in the ventral abdomen just below the umbilicus. No hernia or mass is seen at this level. Findings are likely postoperative adhesions. Enteroenterostomies are noted elsewhere. Vascular/Lymphatic: No acute vascular abnormality. Pelvic lymphadenectomy. No noted mass or adenopathy. Reproductive:Hysterectomy. Other: No ascites or pneumoperitoneum. Musculoskeletal: No acute abnormalities. Notable for advanced L4-5 and L2-3 disc degeneration with endplate sclerosis and irregularity. IMPRESSION: 1. High-grade small bowel obstruction with transition point in the ventral midline abdomen, likely adhesive disease. 2. Cystectomy with ileal conduit.  No unexpected finding. 3. Other areas of enteroenterostomy. 4. Cholecystectomy with dilated common bile duct (14 mm). Please correlate with liver function tests. Electronically Signed   By: Monte Fantasia M.D.   On: 05/05/2017 17:39   Dg Abd Acute W/chest  Result Date: 05/05/2017 CLINICAL DATA:  Right-sided abdominal pain with nausea and vomiting. Check port placement. EXAM: DG ABDOMEN ACUTE W/ 1V CHEST COMPARISON:  None. FINDINGS: There is a single moderately dilated loop of small bowel containing an air-fluid level in the left abdomen. Paucity of remaining small bowel gas. Large colonic stool burden. Bowel anastomotic sutures are noted in the right lower quadrant. Multiple surgical clips in the pelvis. Degenerative disc disease at L4-L5. No acute osseous abnormality. Right chest wall port  catheter with the tip at the cavoatrial junction. Heart size and mediastinal contours are within normal limits. Both lungs are clear. IMPRESSION: 1. Single moderately dilated loop of small bowel containing an air-fluid level in the left abdomen. While nonspecific, given the paucity of small bowel gas, findings could represent partial or early small bowel obstruction. 2.  Prominent stool throughout the colon favors constipation. 3. Appropriately positioned right chest wall port catheter. No active cardiopulmonary disease. Electronically Signed   By: Titus Dubin M.D.   On: 05/05/2017 12:23    Procedures Procedures (including critical care time)  Medications Ordered in ED Medications  cefTRIAXone (ROCEPHIN) 1 g in dextrose 5 % 50  mL IVPB (0 g Intravenous Stopped 05/05/17 1827)  HYDROmorphone (DILAUDID) injection 1 mg (1 mg Intravenous Given 05/06/17 0054)  ondansetron (ZOFRAN) injection 4 mg (4 mg Intravenous Given 05/05/17 1830)  acetaminophen (TYLENOL) tablet 1,000 mg (not administered)  enoxaparin (LOVENOX) injection 40 mg (40 mg Subcutaneous Given 05/05/17 2109)  0.9 %  sodium chloride infusion ( Intravenous New Bag/Given 05/06/17 0531)  albuterol (PROVENTIL) (2.5 MG/3ML) 0.083% nebulizer solution 2.5 mg (not administered)  lactated ringers bolus 2,000 mL (0 mLs Intravenous Stopped 05/05/17 1500)  ondansetron (ZOFRAN) injection 4 mg (4 mg Intravenous Given 05/05/17 1240)  HYDROmorphone (DILAUDID) injection 1 mg (1 mg Intravenous Given 05/05/17 1240)  ketorolac (TORADOL) 15 MG/ML injection 15 mg (15 mg Intravenous Given 05/05/17 1545)  iopamidol (ISOVUE-300) 61 % injection 100 mL (100 mLs Intravenous Contrast Given 05/05/17 1717)  promethazine (PHENERGAN) injection 12.5 mg (12.5 mg Intravenous Given 05/05/17 2108)  ketorolac (TORADOL) 15 MG/ML injection 15 mg (15 mg Intravenous Given 05/05/17 2311)     Initial Impression / Assessment and Plan / ED Course  I have reviewed the triage  vital signs and the nursing notes.  Pertinent labs & imaging results that were available during my care of the patient were reviewed by me and considered in my medical decision making (see chart for details).  Clinical Course as of May 06 817  Thu May 05, 2017  1442 Nitrate positive urine.  Patient reports that she just finished a course of Cipro.  She reports however that despite finishing her Cipro course she still has chills, nausea, flank pain.  Patient has her bladder removed so she typically gets back pain and flank pain with her symptoms. Will tx as pyelonephritis. Nitrite: (!) POSITIVE [AN]    Clinical Course User Index [AN] Varney Biles, MD    Patient comes in with chief complaint of abdominal pain.  She has history of multiple small bowel obstructions and is having nausea with bilious emesis.  Patient did pass flatus, and had bowel movement today.  Acute abdominal series shows evidence of likely small bowel obstruction.  I spoke with general surgery, given history of multiple small bowel obstructions and multiple abdominal surgeries they recommend getting a CT scan and admitting patient to the hospital. Patient's lipase is also elevated, with normal-appearing LFTs.  CT scan, which is pending we will give better morphology for the elevated lipase. Patient also has nitrate positive urine.  Upon further questioning she reports that she is having some flank pain, and was recently treated with Cipro for UTI.  I suspect that she has pyelonephritis, since her bladder resection she never gets classic cystitis-like symptoms, usually gets flank pain.  Patient had reported that she gets respiratory distress, airway edema with NG tube placement.  We will place NG tube if CT scan is positive.  Admitting team made aware of this plan.  Final Clinical Impressions(s) / ED Diagnoses   Final diagnoses:  SBO (small bowel obstruction) [facility])    ED Discharge Orders    None       Varney Biles,  MD 05/06/17 [phone]

## 2017-05-07 ENCOUNTER — Inpatient Hospital Stay (HOSPITAL_COMMUNITY): Payer: Medicare Other

## 2017-05-07 LAB — HEPATIC FUNCTION PANEL
ALK PHOS: 158 U/L — AB (ref 38–126)
ALT: 138 U/L — AB (ref 14–54)
AST: 148 U/L — ABNORMAL HIGH (ref 15–41)
Albumin: 3 g/dL — ABNORMAL LOW (ref 3.5–5.0)
BILIRUBIN DIRECT: 0.2 mg/dL (ref 0.1–0.5)
BILIRUBIN INDIRECT: 0.6 mg/dL (ref 0.3–0.9)
BILIRUBIN TOTAL: 0.8 mg/dL (ref 0.3–1.2)
Total Protein: 5.5 g/dL — ABNORMAL LOW (ref 6.5–8.1)

## 2017-05-07 LAB — CBC
HCT: 31.4 % — ABNORMAL LOW (ref 36.0–46.0)
Hemoglobin: 10.7 g/dL — ABNORMAL LOW (ref 12.0–15.0)
MCH: 31.7 pg (ref 26.0–34.0)
MCHC: 34.1 g/dL (ref 30.0–36.0)
MCV: 92.9 fL (ref 78.0–100.0)
PLATELETS: 207 10*3/uL (ref 150–400)
RBC: 3.38 MIL/uL — AB (ref 3.87–5.11)
RDW: 13.7 % (ref 11.5–15.5)
WBC: 6.2 10*3/uL (ref 4.0–10.5)

## 2017-05-07 LAB — BASIC METABOLIC PANEL
Anion gap: 7 (ref 5–15)
BUN: 16 mg/dL (ref 6–20)
CHLORIDE: 107 mmol/L (ref 101–111)
CO2: 25 mmol/L (ref 22–32)
CREATININE: 0.68 mg/dL (ref 0.44–1.00)
Calcium: 7.9 mg/dL — ABNORMAL LOW (ref 8.9–10.3)
GFR calc non Af Amer: >60 mL/min (ref >60)
Glucose, Bld: 86 mg/dL (ref 65–99)
Potassium: 3.3 mmol/L — ABNORMAL LOW (ref 3.5–5.1)
Sodium: 139 mmol/L (ref 135–145)

## 2017-05-07 LAB — LACTATE DEHYDROGENASE: LDH: 174 U/L (ref 98–192)

## 2017-05-07 LAB — URINE CULTURE

## 2017-05-07 NOTE — Progress Notes (Signed)
General Surgery [facility]  Assessment & Plan: 1. Small bowel obstruction, likely secondary to adhesions             NPO, IV hydration              AXR this AM - ordered             Encouraged ambulation, OOB 2. Elevated LFT's             Hx of open cholecystectomy             Medical team to evaluate and monitor 3. Urinary tract infection             Received Rocephin in ER             Per medical service  Complex patient with recurrent small bowel obstruction at high risk of complications and co-morbidities with any operative intervention.  Patient would like to avoid surgery if at all possible.  States that most of her past obstructions have resolved without surgery.  Patient states she feels much improved, passes flatus around midnight.  Less pain.  Denies nausea or emesis.  Will repeat AXR. If continued improvement, will allow sips clear liquids today and advance as tolerated.         Earnstine Regal, MD, [facility]       Office: [phone]    Chief Complaint: Small bowel obstruction, recurrent  Subjective: Patient sleeping comfortably, awakens easily.  No complaints.  Denies nausea or emesis.  Passed flatus, no BM.  Objective: Vital signs in last 24 hours: Temp:  [97.5 F (36.4 C)-98.9 F (37.2 C)] 97.5 F (36.4 C) (11/24 0523) Pulse Rate:  [67-80] 67 (11/24 0523) Resp:  [16-18] 16 (11/24 0523) BP: (126-140)/(67-85) 126/85 (11/24 0523) SpO2:  [98 %-99 %] 98 % (11/24 0523) Last BM Date: 05/04/17(Per pt report)  Intake/Output from previous day: 11/23 0701 - 11/24 0700 In: 0  Out: 500 [Urine:500] Intake/Output this shift: No intake/output data recorded.  Physical Exam: HEENT - sclerae clear, mucous membranes moist Neck - soft Chest - clear bilaterally Cor - RRR Abdomen - soft, mild distension; BS present; mild diffuse tenderness, no mass, no hernia Ext - no edema, non-tender Neuro - alert &  oriented, no focal deficits  Lab Results:  Recent Labs    05/06/17 0500 05/07/17 0500  WBC 9.1 6.2  HGB 11.6* 10.7*  HCT 33.8* 31.4*  PLT 226 207   BMET Recent Labs    05/06/17 0500 05/07/17 0500  NA 141 139  K 3.8 3.3*  CL 106 107  CO2 28 25  GLUCOSE 100* 86  BUN 21* 16  CREATININE 0.79 0.68  CALCIUM 8.2* 7.9*   PT/INR No results for input(s): LABPROT, INR in the last 72 hours. Comprehensive Metabolic Panel:    Component Value Date/Time   NA 139 05/07/2017 0500   NA 141 05/06/2017 0500   K 3.3 (L) 05/07/2017 0500   K 3.8 05/06/2017 0500   CL 107 05/07/2017 0500   CL 106 05/06/2017 0500   CO2 25 05/07/2017 0500   CO2 28 05/06/2017 0500   BUN 16 05/07/2017 0500   BUN 21 (H) 05/06/2017 0500   CREATININE 0.68 05/07/2017 0500   CREATININE 0.79 05/06/2017 0500   GLUCOSE 86 05/07/2017 0500   GLUCOSE 100 (H) 05/06/2017 0500   CALCIUM 7.9 (L) 05/07/2017 0500   CALCIUM 8.2 (L) 05/06/2017  0500   AST 148 (H) 05/07/2017 0500   AST 252 (H) 05/06/2017 0500   ALT 138 (H) 05/07/2017 0500   ALT 124 (H) 05/06/2017 0500   ALKPHOS 158 (H) 05/07/2017 0500   ALKPHOS 105 05/06/2017 0500   BILITOT 0.8 05/07/2017 0500   BILITOT 1.1 05/06/2017 0500   PROT 5.5 (L) 05/07/2017 0500   PROT 5.5 (L) 05/06/2017 0500   ALBUMIN 3.0 (L) 05/07/2017 0500   ALBUMIN 3.2 (L) 05/06/2017 0500    Studies/Results: Dg Abd 1 View  Result Date: 05/06/2017 CLINICAL DATA:  Follow up small bowel obstruction EXAM: ABDOMEN - 1 VIEW COMPARISON:  05/05/2017 FINDINGS: Scattered large and small bowel gas is noted. A right lower quadrant ostomy is noted. Contrast material is noted within the collecting systems from recent CT. A single loop of prominent small bowel is noted in the mid abdomen. No definitive free air is noted. Degenerative changes of the lumbar spine are noted. IMPRESSION: Single dilated loop of small bowel in the mid abdomen. The overall appearance is improved slightly from the prior exam.  Correlation with the physical exam is recommended. Electronically Signed   By: Inez Catalina Petersen.D.   On: 05/06/2017 07:27   Ct Abdomen Pelvis W Contrast  Result Date: 05/05/2017 CLINICAL DATA:  Abdominal distention. Nausea and vomiting. Bowel obstruction. EXAM: CT ABDOMEN AND PELVIS WITH CONTRAST TECHNIQUE: Multidetector CT imaging of the abdomen and pelvis was performed using the standard protocol following bolus administration of intravenous contrast. CONTRAST:  100 cc Isovue-300 intravenous COMPARISON:  Abdominal CT report from [facility] 01/19/2016. FINDINGS: Lower chest:  No acute finding Hepatobiliary: No focal liver abnormality.Cholecystectomy. Prominent dilatation of the common bile duct measuring up to 14 mm. No calcified choledocholithiasis or mass. Pancreas: Borderline distal pancreatic duct size at 3 mm. No inflammation or masslike finding. Spleen: Unremarkable. Adrenals/Urinary Tract: Negative adrenals. Mild pelviectasis. Gas is seen within the right lower ureter. Status post cystectomy with urinary conduit. Conduit is collapsed currently. Hypoenhancement in the lower pole left kidney is associated with volume loss and best attributed to scarring. No convincing pyelonephritis. Stomach/Bowel: There is dilated small bowel with fluid filling and fecalized material before an abrupt transition in the ventral abdomen just below the umbilicus. No hernia or mass is seen at this level. Findings are likely postoperative adhesions. Enteroenterostomies are noted elsewhere. Vascular/Lymphatic: No acute vascular abnormality. Pelvic lymphadenectomy. No noted mass or adenopathy. Reproductive:Hysterectomy. Other: No ascites or pneumoperitoneum. Musculoskeletal: No acute abnormalities. Notable for advanced L4-5 and L2-3 disc degeneration with endplate sclerosis and irregularity. IMPRESSION: 1. High-grade small bowel obstruction with transition point in the ventral midline abdomen, likely adhesive disease. 2.  Cystectomy with ileal conduit.  No unexpected finding. 3. Other areas of enteroenterostomy. 4. Cholecystectomy with dilated common bile duct (14 mm). Please correlate with liver function tests. Electronically Signed   By: Monte Fantasia Petersen.D.   On: 05/05/2017 17:39   Dg Abd Acute W/chest  Result Date: 05/05/2017 CLINICAL DATA:  Right-sided abdominal pain with nausea and vomiting. Check port placement. EXAM: DG ABDOMEN ACUTE W/ 1V CHEST COMPARISON:  None. FINDINGS: There is a single moderately dilated loop of small bowel containing an air-fluid level in the left abdomen. Paucity of remaining small bowel gas. Large colonic stool burden. Bowel anastomotic sutures are noted in the right lower quadrant. Multiple surgical clips in the pelvis. Degenerative disc disease at L4-L5. No acute osseous abnormality. Right chest wall port catheter with the tip at the cavoatrial junction. Heart size and  mediastinal contours are within normal limits. Both lungs are clear. IMPRESSION: 1. Single moderately dilated loop of small bowel containing an air-fluid level in the left abdomen. While nonspecific, given the paucity of small bowel gas, findings could represent partial or early small bowel obstruction. 2.  Prominent stool throughout the colon favors constipation. 3. Appropriately positioned right chest wall port catheter. No active cardiopulmonary disease. Electronically Signed   By: Titus Dubin Petersen.D.   On: 05/05/2017 12:23      Sheila Petersen 05/07/2017  Patient ID: Vear Clock, female   DOB: Oct 24, 1955, 61 y.o.   MRN: [id number]

## 2017-05-07 NOTE — Progress Notes (Signed)
PROGRESS NOTE  Sheila Petersen FGH:[id number] DOB: 10-21-1955 DOA: 05/05/2017 PCP: System, Pcp Not In   LOS: 2 days   Brief Narrative / Interim history: 61 y.o. 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  Assessment & Plan: Active Problems:   SBO (small bowel obstruction) (HCC)   CLL (chronic lymphocytic leukemia) (HCC)   Elevated liver enzymes   Urinary tract infection   SBO (small bowel obstruction) (HCC) -Present on the CT scan, patient reports a long-standing history of recurrent small bowel obstructions requiring LOA in the past -no vomiting however has some nausea, passing flatus but no BM yet -abdominal pain improved -repeat abdominal film  Elevated liver enzymes -transient LFT elevation 11/23, improving today.  -CT scan with no focal liver abnormality, status post cholecystectomy she did have CBD dilation up to 14 mm -monitor, she appears to have had a similar episode in January 2018 in the setting of an admission to sepsis and was thought to be in the setting of a viral illness.  She tested negative for hep B and C in the past.  CLL (chronic lymphocytic leukemia) ([place]) -Follow-up with oncology at [facility], she has Rituxin in the past per chart review in care everywhere  H/o cold agglutinin disease -Hemoglobin overall stable, slightly lower likely delusional, LDH is normal at 174, bilirubin is normal suggesting against hemolysis  Possible UTI/ pyelonephritis  -Continue ceftriaxone, urine cultures with multiple species. Will treat for 1 more  day  HTN -hold amlodipine while NPO    DVT prophylaxis: Lovenox Code Status: Full code Family Communication: No family at bedside Disposition Plan: Home when ready  Consultants:   General surgery  Procedures:   None   Antimicrobials:  Ceftriaxone 11/22 >>   Subjective: -abdominal pain has improved but not resolved. Has nausea no vomiting. No chest pain / dyspnea  Objective: Vitals:   05/06/17 1427 05/06/17 1551 05/06/17 2125 05/07/17 0523  BP: 126/67 140/74 131/77 126/85  Pulse: 80 80 74 67  Resp: 18 18 16 16   Temp: 98.9 F (37.2 C) 98 F (36.7 C) 98.6 F (37 C) (!) 97.5 F (36.4 C)  TempSrc: Oral Oral Oral Oral  SpO2: 99% 99% 99% 98%  Weight:      Height:        Intake/Output Summary (Last 24 hours) at 05/07/2017 1207 Last data filed at 05/07/2017 0523 Gross per 24 hour  Intake 0 ml  Output 500 ml  Net -500 ml   Filed Weights   05/05/17 1915  Weight: 74.8 kg (165 lb)    Examination:  Constitutional: NAD, calm, comfortable Eyes: lids and conjunctivae normal Respiratory: clear to auscultation bilaterally, no wheezing, no crackles. Normal respiratory effort.  Cardiovascular: Regular rate and rhythm, no murmurs / rubs / gallops. No LE edema. 2+ pedal pulses.  Abdomen: + tenderness throughout. Bowel sounds diminished Neurologic: non focal   Data Reviewed: I have independently reviewed following labs and imaging studies   CBC: Recent Labs  Lab 05/05/17 1229 05/06/17 0500 05/07/17 0500  WBC 12.9* 9.1 6.2  HGB 14.2 11.6* 10.7*  HCT 39.7 33.8* 31.4*  MCV 90.8 89.4 92.9  PLT 279 226 720   Basic Metabolic Panel: Recent Labs  Lab 05/05/17 1430 05/06/17 0500 05/07/17 0500  NA 139 141 139  K 3.9 3.8 3.3*  CL 104 106 107  CO2 28 28 25   GLUCOSE 87 100* 86  BUN 19 21* 16  CREATININE 0.76 0.79 0.68  CALCIUM 8.4* 8.2* 7.9*   GFR: Estimated Creatinine Clearance: 73.6 mL/min (by C-G formula based on SCr of 0.68 mg/dL). Liver Function  Tests: Recent Labs  Lab 05/05/17 1430 05/06/17 0500 05/07/17 0500  AST 21 252* 148*  ALT 15 124* 138*  ALKPHOS 62 105 158*  BILITOT 1.0 1.1 0.8  PROT 6.2* 5.5* 5.5*  ALBUMIN 3.5 3.2* 3.0*   Recent Labs  Lab 05/05/17 1430  LIPASE 168*   No results for input(s): AMMONIA in the last 168 hours. Coagulation Profile: No results for input(s): INR, PROTIME in the last 168 hours. Cardiac Enzymes: No results for input(s): CKTOTAL, CKMB, CKMBINDEX, TROPONINI in the last 168 hours. BNP (last 3 results) No results for input(s): PROBNP in the last 8760 hours. HbA1C: No results for input(s): HGBA1C in the last 72 hours. CBG: No results for input(s): GLUCAP in the last 168 hours. Lipid Profile: No results for input(s): CHOL, HDL, LDLCALC, TRIG, CHOLHDL, LDLDIRECT in the last 72 hours. Thyroid Function Tests: No results for input(s): TSH, T4TOTAL, FREET4, T3FREE, THYROIDAB in the last 72 hours. Anemia Panel: No results for input(s): VITAMINB12, FOLATE, FERRITIN, TIBC, IRON, RETICCTPCT in the last 72 hours. Urine analysis:    Component Value Date/Time   COLORURINE YELLOW 05/05/2017 1250   APPEARANCEUR HAZY (A) 05/05/2017 1250   LABSPEC 1.018 05/05/2017 1250   PHURINE 7.0 05/05/2017 1250   GLUCOSEU NEGATIVE 05/05/2017 1250   HGBUR NEGATIVE 05/05/2017 1250   BILIRUBINUR NEGATIVE 05/05/2017 1250   KETONESUR 20 (A) 05/05/2017 1250   PROTEINUR 30 (A) 05/05/2017 1250   NITRITE POSITIVE (A) 05/05/2017 1250   LEUKOCYTESUR TRACE (A) 05/05/2017 1250   Sepsis Labs: Invalid input(s): PROCALCITONIN, LACTICIDVEN  Recent Results (from the past 240 hour(s))  Culture, Urine     Status: Abnormal   Collection Time: 05/05/17 12:50 PM  Result Value Ref Range Status   Specimen Description URINE, RANDOM  Final   Special Requests NONE  Final   Culture MULTIPLE SPECIES PRESENT, SUGGEST RECOLLECTION (A)  Final   Report Status 05/07/2017 FINAL  Final      Radiology Studies: Dg Abd 1  View  Result Date: 05/06/2017 CLINICAL DATA:  Follow up small bowel obstruction EXAM: ABDOMEN - 1 VIEW COMPARISON:  05/05/2017 FINDINGS: Scattered large and small bowel gas is noted. A right lower quadrant ostomy is noted. Contrast material is noted within the collecting systems from recent CT. A single loop of prominent small bowel is noted in the mid abdomen. No definitive free air is noted. Degenerative changes of the lumbar spine are noted. IMPRESSION: Single dilated loop of small bowel in the mid abdomen. The overall appearance is improved slightly from the prior exam. Correlation with the physical exam is recommended. Electronically Signed   By: Inez Catalina M.D.   On: 05/06/2017 07:27   Ct Abdomen Pelvis W Contrast  Result Date: 05/05/2017 CLINICAL DATA:  Abdominal distention. Nausea and vomiting. Bowel obstruction. EXAM: CT ABDOMEN AND PELVIS WITH CONTRAST TECHNIQUE: Multidetector CT imaging of the abdomen and pelvis was performed using the standard protocol following bolus administration of intravenous contrast. CONTRAST:  100 cc Isovue-300 intravenous COMPARISON:  Abdominal CT report from [facility]  01/19/2016. FINDINGS: Lower chest:  No acute finding Hepatobiliary: No focal liver abnormality.Cholecystectomy. Prominent dilatation of the common bile duct measuring up to 14 mm. No calcified choledocholithiasis or mass. Pancreas: Borderline distal pancreatic duct size at 3 mm. No inflammation or masslike finding. Spleen: Unremarkable. Adrenals/Urinary Tract: Negative adrenals. Mild pelviectasis. Gas is seen within the right lower ureter. Status post cystectomy with urinary conduit. Conduit is collapsed currently. Hypoenhancement in the lower pole left kidney is associated with volume loss and best attributed to scarring. No convincing pyelonephritis. Stomach/Bowel: There is dilated small bowel with fluid filling and fecalized material before an abrupt transition in the ventral abdomen just below the  umbilicus. No hernia or mass is seen at this level. Findings are likely postoperative adhesions. Enteroenterostomies are noted elsewhere. Vascular/Lymphatic: No acute vascular abnormality. Pelvic lymphadenectomy. No noted mass or adenopathy. Reproductive:Hysterectomy. Other: No ascites or pneumoperitoneum. Musculoskeletal: No acute abnormalities. Notable for advanced L4-5 and L2-3 disc degeneration with endplate sclerosis and irregularity. IMPRESSION: 1. High-grade small bowel obstruction with transition point in the ventral midline abdomen, likely adhesive disease. 2. Cystectomy with ileal conduit.  No unexpected finding. 3. Other areas of enteroenterostomy. 4. Cholecystectomy with dilated common bile duct (14 mm). Please correlate with liver function tests. Electronically Signed   By: Monte Fantasia M.D.   On: 05/05/2017 17:39   Dg Abd 2 Views  Result Date: 05/07/2017 CLINICAL DATA:  61 year old female with history of abdominal distention. Evaluate for small bowel obstruction. EXAM: ABDOMEN - 2 VIEW COMPARISON:  Abdominal radiograph 05/06/2017. FINDINGS: Gas and stool are noted throughout the colon extending to the level of the distal rectum. No pathologic dilatation of small bowel is noted on today's examination. Air-fluid level is noted in a loop of small bowel on the upright projection. No pneumoperitoneum. Stoma seen projecting over the right lower quadrant. Multiple surgical clips projecting over the low anatomic pelvis and suture lines projecting over the lower right abdomen. IMPRESSION: 1. Nonspecific, nonobstructive bowel gas pattern, as above. 2. No pneumoperitoneum. Electronically Signed   By: Vinnie Langton M.D.   On: 05/07/2017 10:18   Dg Abd Acute W/chest  Result Date: 05/05/2017 CLINICAL DATA:  Right-sided abdominal pain with nausea and vomiting. Check port placement. EXAM: DG ABDOMEN ACUTE W/ 1V CHEST COMPARISON:  None. FINDINGS: There is a single moderately dilated loop of small bowel  containing an air-fluid level in the left abdomen. Paucity of remaining small bowel gas. Large colonic stool burden. Bowel anastomotic sutures are noted in the right lower quadrant. Multiple surgical clips in the pelvis. Degenerative disc disease at L4-L5. No acute osseous abnormality. Right chest wall port catheter with the tip at the cavoatrial junction. Heart size and mediastinal contours are within normal limits. Both lungs are clear. IMPRESSION: 1. Single moderately dilated loop of small bowel containing an air-fluid level in the left abdomen. While nonspecific, given the paucity of small bowel gas, findings could represent partial or early small bowel obstruction. 2.  Prominent stool throughout the colon favors constipation. 3. Appropriately positioned right chest wall port catheter. No active cardiopulmonary disease. Electronically Signed   By: Titus Dubin M.D.   On: 05/05/2017 12:23     Scheduled Meds: . amitriptyline  150 mg Oral QHS  . enoxaparin (LOVENOX) injection  40 mg Subcutaneous Q24H   Continuous Infusions: . sodium chloride 100 mL/hr at 05/07/17 0521  . cefTRIAXone (ROCEPHIN)  IV 1 g (05/06/17 1747)    Marzetta Board, MD, PhD [facility] Pager [phone]  If 7PM-7AM, please contact night-coverage [email or web address] Password [facility] 05/07/2017, 12:07 PM

## 2017-05-08 ENCOUNTER — Inpatient Hospital Stay (HOSPITAL_COMMUNITY): Payer: Medicare Other

## 2017-05-08 LAB — CBC
HCT: 30.6 % — ABNORMAL LOW (ref 36.0–46.0)
HEMOGLOBIN: 10.3 g/dL — AB (ref 12.0–15.0)
MCH: 29.3 pg (ref 26.0–34.0)
MCHC: 33.7 g/dL (ref 30.0–36.0)
MCV: 86.9 fL (ref 78.0–100.0)
Platelets: 191 10*3/uL (ref 150–400)
RBC: 3.52 MIL/uL — AB (ref 3.87–5.11)
RDW: 13.6 % (ref 11.5–15.5)
WBC: 6.9 10*3/uL (ref 4.0–10.5)

## 2017-05-08 LAB — COMPREHENSIVE METABOLIC PANEL
ALK PHOS: 133 U/L — AB (ref 38–126)
ALT: 90 U/L — ABNORMAL HIGH (ref 14–54)
ANION GAP: 4 — AB (ref 5–15)
AST: 66 U/L — ABNORMAL HIGH (ref 15–41)
Albumin: 2.8 g/dL — ABNORMAL LOW (ref 3.5–5.0)
BILIRUBIN TOTAL: 0.5 mg/dL (ref 0.3–1.2)
BUN: 8 mg/dL (ref 6–20)
CALCIUM: 7.7 mg/dL — AB (ref 8.9–10.3)
CO2: 26 mmol/L (ref 22–32)
Chloride: 108 mmol/L (ref 101–111)
Creatinine, Ser: 0.66 mg/dL (ref 0.44–1.00)
GFR calc non Af Amer: >60 mL/min (ref >60)
Glucose, Bld: 94 mg/dL (ref 65–99)
Potassium: 4.2 mmol/L (ref 3.5–5.1)
SODIUM: 138 mmol/L (ref 135–145)
TOTAL PROTEIN: 5.2 g/dL — AB (ref 6.5–8.1)

## 2017-05-08 LAB — HAPTOGLOBIN: HAPTOGLOBIN: 44 mg/dL (ref 34–200)

## 2017-05-08 LAB — HIV ANTIBODY (ROUTINE TESTING W REFLEX): HIV Screen 4th Generation wRfx: NONREACTIVE

## 2017-05-08 MED ORDER — AMITRIPTYLINE HCL 50 MG PO TABS
150.0000 mg | ORAL_TABLET | Freq: Every day | ORAL | Status: DC
  Administered 2017-05-08 – 2017-05-10 (×3): 150 mg via ORAL
  Filled 2017-05-08 (×3): qty 3

## 2017-05-08 NOTE — Progress Notes (Signed)
PROGRESS NOTE  Sheila Petersen TML:[id number] DOB: 1955/06/16 DOA: 05/05/2017 PCP: System, Pcp Not In   LOS: 3 days   Brief Narrative / Interim history: 61 y.o. 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  Assessment & Plan: Active Problems:   SBO (small bowel obstruction) (HCC)   CLL (chronic lymphocytic leukemia) (HCC)   Elevated liver enzymes   Urinary tract infection   SBO (small bowel obstruction) (HCC) -Present on the CT scan, patient reports a long-standing history of recurrent small bowel obstructions requiring LOA in the past -initially improving, passing gas and BMs, her diet was advanced on 11/25 however patient developed significant abdominal distention, nausea and heaving after lunch -back to NPO, d/w Dr. Harlow Asa, repeat abdominal XR  Elevated liver enzymes -transient LFT elevation 11/23, improving since -CT scan with no focal liver abnormality, status post cholecystectomy she did have CBD dilation up to 14 mm -monitor, she appears to have had a similar episode in January 2018 in the setting of an admission. She tested negative for hep B and C in the past.  CLL (chronic lymphocytic leukemia) ([place]) -Follow-up with oncology at [facility], she has Rituxin in the past per chart review in care everywhere  H/o cold agglutinin disease -Hemoglobin overall stable, slightly lower likely delusional, LDH is normal at 174, bilirubin is normal suggesting against hemolysis  Possible UTI -Continue ceftriaxone, s/p 3 days. Cultures with multiple  species  HTN -hold amlodipine while NPO    DVT prophylaxis: Lovenox Code Status: Full code Family Communication: No family at bedside Disposition Plan: Home when ready  Consultants:   General surgery  Procedures:   None   Antimicrobials:  Ceftriaxone 11/22 >>   Subjective: -hopeful for home d/c in am however after lunch she developed abdominal distention and dry heaving.   Objective: Vitals:   05/07/17 1334 05/07/17 1944 05/08/17 0452 05/08/17 1404  BP: 136/73 125/75 113/70 117/81  Pulse: 73 71 72 66  Resp: 16 16 18 16   Temp: 98.3 F (36.8 C) 98.4 F (36.9 C) 98.4 F (36.9 C) 98.4 F (36.9 C)  TempSrc: Oral Oral Oral Oral  SpO2: 98% 94% 94% 96%  Weight:      Height:        Intake/Output Summary (Last 24 hours) at 05/08/2017 1532 Last data filed at 05/08/2017 0830 Gross per 24 hour  Intake 240 ml  Output 1650 ml  Net -1410 ml   Filed Weights   05/05/17 1915  Weight: 74.8 kg (165 lb)    Examination:  Constitutional: NAD Eyes: no scleral icterus Respiratory: CTA, no wheezing, no crackles Cardiovascular: RRR without MRG, no edema Abdomen: tender throughout, distended, voluntary guarding. Diminished BS. Ostomy in place. Old scars present Neurologic: non focal, ambulatory    Data Reviewed: I have independently reviewed following labs and imaging studies   CBC: Recent Labs  Lab 05/05/17 1229 05/06/17 0500 05/07/17 0500 05/08/17 0500  WBC 12.9* 9.1 6.2 6.9  HGB 14.2 11.6* 10.7* 10.3*  HCT 39.7 33.8* 31.4* 30.6*  MCV 90.8 89.4 92.9 86.9  PLT 279 226 207 681   Basic Metabolic Panel: Recent Labs  Lab 05/05/17  1430 05/06/17 0500 05/07/17 0500 05/08/17 0500  NA 139 141 139 138  K 3.9 3.8 3.3* 4.2  CL 104 106 107 108  CO2 28 28 25 26   GLUCOSE 87 100* 86 94  BUN 19 21* 16 8  CREATININE 0.76 0.79 0.68 0.66  CALCIUM 8.4* 8.2* 7.9* 7.7*   GFR: Estimated Creatinine Clearance: 73.6 mL/min (by C-G formula based on SCr of 0.66 mg/dL). Liver  Function Tests: Recent Labs  Lab 05/05/17 1430 05/06/17 0500 05/07/17 0500 05/08/17 0500  AST 21 252* 148* 66*  ALT 15 124* 138* 90*  ALKPHOS 62 105 158* 133*  BILITOT 1.0 1.1 0.8 0.5  PROT 6.2* 5.5* 5.5* 5.2*  ALBUMIN 3.5 3.2* 3.0* 2.8*   Recent Labs  Lab 05/05/17 1430  LIPASE 168*   No results for input(s): AMMONIA in the last 168 hours. Coagulation Profile: No results for input(s): INR, PROTIME in the last 168 hours. Cardiac Enzymes: No results for input(s): CKTOTAL, CKMB, CKMBINDEX, TROPONINI in the last 168 hours. BNP (last 3 results) No results for input(s): PROBNP in the last 8760 hours. HbA1C: No results for input(s): HGBA1C in the last 72 hours. CBG: No results for input(s): GLUCAP in the last 168 hours. Lipid Profile: No results for input(s): CHOL, HDL, LDLCALC, TRIG, CHOLHDL, LDLDIRECT in the last 72 hours. Thyroid Function Tests: No results for input(s): TSH, T4TOTAL, FREET4, T3FREE, THYROIDAB in the last 72 hours. Anemia Panel: No results for input(s): VITAMINB12, FOLATE, FERRITIN, TIBC, IRON, RETICCTPCT in the last 72 hours. Urine analysis:    Component Value Date/Time   COLORURINE YELLOW 05/05/2017 1250   APPEARANCEUR HAZY (A) 05/05/2017 1250   LABSPEC 1.018 05/05/2017 1250   PHURINE 7.0 05/05/2017 1250   GLUCOSEU NEGATIVE 05/05/2017 1250   HGBUR NEGATIVE 05/05/2017 1250   BILIRUBINUR NEGATIVE 05/05/2017 1250   KETONESUR 20 (A) 05/05/2017 1250   PROTEINUR 30 (A) 05/05/2017 1250   NITRITE POSITIVE (A) 05/05/2017 1250   LEUKOCYTESUR TRACE (A) 05/05/2017 1250   Sepsis Labs: Invalid input(s): PROCALCITONIN, LACTICIDVEN  Recent Results (from the past 240 hour(s))  Culture, Urine     Status: Abnormal   Collection Time: 05/05/17 12:50 PM  Result Value Ref Range Status   Specimen Description URINE, RANDOM  Final   Special Requests NONE  Final   Culture MULTIPLE SPECIES PRESENT, SUGGEST RECOLLECTION (A)  Final   Report Status 05/07/2017 FINAL  Final       Radiology Studies: Dg Abd 2 Views  Result Date: 05/07/2017 CLINICAL DATA:  61 year old female with history of abdominal distention. Evaluate for small bowel obstruction. EXAM: ABDOMEN - 2 VIEW COMPARISON:  Abdominal radiograph 05/06/2017. FINDINGS: Gas and stool are noted throughout the colon extending to the level of the distal rectum. No pathologic dilatation of small bowel is noted on today's examination. Air-fluid level is noted in a loop of small bowel on the upright projection. No pneumoperitoneum. Stoma seen projecting over the right lower quadrant. Multiple surgical clips projecting over the low anatomic pelvis and suture lines projecting over the lower right abdomen. IMPRESSION: 1. Nonspecific, nonobstructive bowel gas pattern, as above. 2. No pneumoperitoneum. Electronically Signed   By: Vinnie Langton M.D.   On: 05/07/2017 10:18     Scheduled Meds: . amitriptyline  150 mg Oral QHS  . enoxaparin (LOVENOX) injection  40 mg Subcutaneous Q24H   Continuous Infusions: . sodium chloride 100 mL/hr at 05/08/17 1207  . cefTRIAXone (ROCEPHIN)  IV 1 g (05/07/17 1724)    Jelissa Espiritu,  MD, PhD [facility] Pager [phone]  If 7PM-7AM, please contact night-coverage [email or web address] Password [facility] 05/08/2017, 3:32 PM

## 2017-05-08 NOTE — Progress Notes (Signed)
General Surgery [facility]  Assessment & Plan: 1. Small bowel obstruction, likely secondary to adhesions Tolerated clear liquid diet - advance to regular this AM             AXR yesterday with normal bowel gas pattern Encouraged ambulation, OOB 2. Elevated LFT's Hx of open cholecystectomy Medical team to evaluate and monitor 3. Urinary tract infection Abx's per medical service  Complex patient with recurrent small bowel obstruction at high risk of complications and co-morbidities with any operative intervention.  Will advance diet this AM.  Passing flatus.  Minimal pain and nausea.  If diet tolerated, OK for discharge from surgical standpoint.  Patient plans follow up at [facility] upon return home to [place] area.  No operative intervention necessary at this time.        Earnstine Regal, MD, [facility]       Office: [phone]    Chief Complaint: Small bowel obstruction  Subjective: Patient feels well.  No complaints.  Minimal pain.  Passing flatus, no BM.  Would like to try regular diet.  Objective: Vital signs in last 24 hours: Temp:  [98.3 F (36.8 C)-98.4 F (36.9 C)] 98.4 F (36.9 C) (11/25 0452) Pulse Rate:  [71-73] 72 (11/25 0452) Resp:  [16-18] 18 (11/25 0452) BP: (113-136)/(70-75) 113/70 (11/25 0452) SpO2:  [94 %-98 %] 94 % (11/25 0452) Last BM Date: 05/04/17  Intake/Output from previous day: 11/24 0701 - 11/25 0700 In: -  Out: 1750 [Urine:1750] Intake/Output this shift: No intake/output data recorded.  Physical Exam: HEENT - sclerae clear, mucous membranes moist Neck - soft Chest - clear bilaterally Cor - RRR Abdomen - soft, mild distension; BS present; minimal diffuse tenderness Ext - no edema, non-tender Neuro - alert & oriented, no focal deficits  Lab Results:  Recent Labs    05/07/17 0500 05/08/17 0500  WBC 6.2 6.9  HGB  10.7* 10.3*  HCT 31.4* 30.6*  PLT 207 191   BMET Recent Labs    05/07/17 0500 05/08/17 0500  NA 139 138  K 3.3* 4.2  CL 107 108  CO2 25 26  GLUCOSE 86 94  BUN 16 8  CREATININE 0.68 0.66  CALCIUM 7.9* 7.7*   PT/INR No results for input(s): LABPROT, INR in the last 72 hours. Comprehensive Metabolic Panel:    Component Value Date/Time   NA 138 05/08/2017 0500   NA 139 05/07/2017 0500   K 4.2 05/08/2017 0500   K 3.3 (L) 05/07/2017 0500   CL 108 05/08/2017 0500   CL 107 05/07/2017 0500   CO2 26 05/08/2017 0500   CO2 25 05/07/2017 0500   BUN 8 05/08/2017 0500   BUN 16 05/07/2017 0500   CREATININE 0.66 05/08/2017 0500   CREATININE 0.68 05/07/2017 0500   GLUCOSE 94 05/08/2017 0500   GLUCOSE 86 05/07/2017 0500   CALCIUM 7.7 (L) 05/08/2017 0500   CALCIUM 7.9 (L) 05/07/2017 0500   AST 66 (H) 05/08/2017 0500   AST 148 (H) 05/07/2017 0500   ALT 90 (H) 05/08/2017 0500   ALT 138 (H) 05/07/2017 0500   ALKPHOS 133 (H) 05/08/2017 0500   ALKPHOS 158 (H) 05/07/2017 0500   BILITOT 0.5 05/08/2017 0500   BILITOT 0.8 05/07/2017 0500   PROT 5.2 (L) 05/08/2017 0500   PROT 5.5 (L) 05/07/2017 0500   ALBUMIN 2.8 (L) 05/08/2017 0500   ALBUMIN 3.0 (L) 05/07/2017 0500    Studies/Results:  Dg Abd 2 Views  Result Date: 05/07/2017 CLINICAL DATA:  61 year old female with history of abdominal distention. Evaluate for small bowel obstruction. EXAM: ABDOMEN - 2 VIEW COMPARISON:  Abdominal radiograph 05/06/2017. FINDINGS: Gas and stool are noted throughout the colon extending to the level of the distal rectum. No pathologic dilatation of small bowel is noted on today's examination. Air-fluid level is noted in a loop of small bowel on the upright projection. No pneumoperitoneum. Stoma seen projecting over the right lower quadrant. Multiple surgical clips projecting over the low anatomic pelvis and suture lines projecting over the lower right abdomen. IMPRESSION: 1. Nonspecific, nonobstructive bowel  gas pattern, as above. 2. No pneumoperitoneum. Electronically Signed   By: Sheila Petersen.D.   On: 05/07/2017 10:18      Sheila Petersen 05/08/2017  [name], female   DOB: Oct 15, 1955, 61 y.o.   MRN: [id number]

## 2017-05-09 ENCOUNTER — Inpatient Hospital Stay (HOSPITAL_COMMUNITY): Payer: Medicare Other

## 2017-05-09 DIAGNOSIS — C9190 Lymphoid leukemia, unspecified not having achieved remission: Secondary | ICD-10-CM

## 2017-05-09 DIAGNOSIS — K56609 Unspecified intestinal obstruction, unspecified as to partial versus complete obstruction: Secondary | ICD-10-CM

## 2017-05-09 DIAGNOSIS — R748 Abnormal levels of other serum enzymes: Secondary | ICD-10-CM

## 2017-05-09 LAB — COMPREHENSIVE METABOLIC PANEL
ALK PHOS: 112 U/L (ref 38–126)
ALT: 65 U/L — AB (ref 14–54)
ANION GAP: 4 — AB (ref 5–15)
AST: 35 U/L (ref 15–41)
Albumin: 3.1 g/dL — ABNORMAL LOW (ref 3.5–5.0)
BILIRUBIN TOTAL: 0.4 mg/dL (ref 0.3–1.2)
BUN: 6 mg/dL (ref 6–20)
CALCIUM: 7.9 mg/dL — AB (ref 8.9–10.3)
CO2: 26 mmol/L (ref 22–32)
CREATININE: 0.72 mg/dL (ref 0.44–1.00)
Chloride: 109 mmol/L (ref 101–111)
GFR calc Af Amer: >60 mL/min (ref >60)
GFR calc non Af Amer: >60 mL/min (ref >60)
GLUCOSE: 96 mg/dL (ref 65–99)
Potassium: 3.5 mmol/L (ref 3.5–5.1)
Sodium: 139 mmol/L (ref 135–145)
TOTAL PROTEIN: 5.5 g/dL — AB (ref 6.5–8.1)

## 2017-05-09 LAB — CBC
HCT: 32 % — ABNORMAL LOW (ref 36.0–46.0)
HEMOGLOBIN: 10.7 g/dL — AB (ref 12.0–15.0)
MCH: 29.8 pg (ref 26.0–34.0)
MCHC: 33.4 g/dL (ref 30.0–36.0)
MCV: 89.1 fL (ref 78.0–100.0)
PLATELETS: 201 10*3/uL (ref 150–400)
RBC: 3.59 MIL/uL — ABNORMAL LOW (ref 3.87–5.11)
RDW: 13.8 % (ref 11.5–15.5)
WBC: 5.5 10*3/uL (ref 4.0–10.5)

## 2017-05-09 NOTE — Care Management Important Message (Signed)
Important Message  Patient Details  Name: Siddalee Vanderheiden MRN: [id number] Date of Birth: [birth year]   Medicare Important Message Given:  Yes    Kerin Salen 05/09/2017, 12:22 [place] Message  Patient Details  Name: Ashyra Cantin MRN: [id number] Date of Birth: 1955-08-05   Medicare Important Message Given:  Yes    Kerin Salen 05/09/2017, 12:22 PM

## 2017-05-09 NOTE — Progress Notes (Signed)
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  Assessment & Plan:   Active Problems:   SBO (small bowel obstruction) (HCC)   CLL (chronic lymphocytic leukemia) (HCC)   Elevated liver enzymes   Urinary tract infection   SBO (small bowel obstruction) (HCC) -Present on the CT scan, patient reports a long-standing history of recurrent small bowel obstructions requiring LOA in the past -initially improving, passing gas and BMs, her diet was advanced on 11/25 however patient developed significant abdominal distention, nausea and heaving after lunch - Will discuss with surgery today  Elevated liver enzymes -transient LFT elevation 11/23, improving since.  Etiology is unclear at this time.    -CT scan with no focal liver abnormality, status post cholecystectomy she did have CBD dilation up to 14 mm (dilated up to 10 mm on 09/01/15 CT in care everywhere) -monitor, she appears to have had a similar episode in January 2018 in the setting of an admission. She tested negative for hep B and C in the past (seen by previous provider, I'm unable to see these results).  CLL (chronic lymphocytic leukemia) ([place]) -Follow-up with oncology at [facility], she has Rituxin in the past per chart review in care everywhere  H/o cold agglutinin disease -Hemoglobin overall stable, slightly lower likely delusional, LDH is normal at 174, bilirubin is normal  suggesting against hemolysis  Possible UTI -ceftriaxone, s/p 3 days. Cultures with multiple species  HTN -hold amlodipine while NPO   DVT prophylaxis: lovenox Code Status: full  Family Communication: none at bedside Disposition Plan: pending improvement in symptoms   Consultants:   Surgery  Procedures: (Don't include imaging studies which can be auto populated. Include things that cannot be auto populated i.e. Echo, Carotid and venous dopplers, Foley, Bipap, HD, tubes/drains, wound vac, central lines etc)  none  Antimicrobials:  Anti-infectives (From admission, onward)   Start     Dose/Rate Route Frequency Ordered Stop   05/05/17 1800  cefTRIAXone (ROCEPHIN) 1 g in dextrose 5 % 50 mL IVPB  Status:  Discontinued     1 g 100 mL/hr over 30 Minutes Intravenous Every 24 hours 05/05/17 1748 05/08/17 1534       Subjective: Cramping abdominal pain, but improving overall.  No fevers, CP, or SOB.  Overall feels better than yesterday. Passing flatus.  BM yesterday.  Objective: Vitals:   05/08/17 0452 05/08/17 1404 05/08/17 2035 05/09/17 0550  BP: 113/70 117/81 129/80 119/73  Pulse: 72 66 76 74  Resp: 18 16 18 18   Temp: 98.4 F (36.9 C) 98.4 F (36.9 C) 98.2 F (36.8 C) 98.3 F (36.8 C)  TempSrc: Oral Oral Oral Oral  SpO2: 94% 96% 98% 96%  Weight:      Height:        Intake/Output Summary (Last 24 hours) at 05/09/2017 0923 Last data filed at 05/09/2017 0600 Gross per 24 hour  Intake 13540 ml  Output 1200 ml  Net 12340 ml   Filed Weights   05/05/17 1915  Weight: 74.8 kg (165 lb)  Examination:  General exam: Appears calm and comfortable  Respiratory system: Clear to auscultation. Respiratory effort normal. Cardiovascular system: S1 & S2 heard, RRR. No JVD, murmurs, rubs, gallops or clicks. No pedal edema. Gastrointestinal system: Diffusely tender to lower quadrants. No organomegaly or masses felt. Bowel sounds present.  Ileostomy present. Central  nervous system: Alert and oriented. No focal neurological deficits. Extremities: Moving all extremities equally. Skin: No rashes, lesions or ulcers Psychiatry: Judgement and insight appear normal. Mood & affect appropriate.     Data Reviewed: I have personally reviewed following labs and imaging studies  CBC: Recent Labs  Lab 05/05/17 1229 05/06/17 0500 05/07/17 0500 05/08/17 0500  WBC 12.9* 9.1 6.2 6.9  HGB 14.2 11.6* 10.7* 10.3*  HCT 39.7 33.8* 31.4* 30.6*  MCV 90.8 89.4 92.9 86.9  PLT 279 226 207 858   Basic Metabolic Panel: Recent Labs  Lab 05/05/17 1430 05/06/17 0500 05/07/17 0500 05/08/17 0500  NA 139 141 139 138  K 3.9 3.8 3.3* 4.2  CL 104 106 107 108  CO2 28 28 25 26   GLUCOSE 87 100* 86 94  BUN 19 21* 16 8  CREATININE 0.76 0.79 0.68 0.66  CALCIUM 8.4* 8.2* 7.9* 7.7*   GFR: Estimated Creatinine Clearance: 73.6 mL/min (by C-G formula based on SCr of 0.66 mg/dL). Liver Function Tests: Recent Labs  Lab 05/05/17 1430 05/06/17 0500 05/07/17 0500 05/08/17 0500  AST 21 252* 148* 66*  ALT 15 124* 138* 90*  ALKPHOS 62 105 158* 133*  BILITOT 1.0 1.1 0.8 0.5  PROT 6.2* 5.5* 5.5* 5.2*  ALBUMIN 3.5 3.2* 3.0* 2.8*   Recent Labs  Lab 05/05/17 1430  LIPASE 168*   No results for input(s): AMMONIA in the last 168 hours. Coagulation Profile: No results for input(s): INR, PROTIME in the last 168 hours. Cardiac Enzymes: No results for input(s): CKTOTAL, CKMB, CKMBINDEX, TROPONINI in the last 168 hours. BNP (last 3 results) No results for input(s): PROBNP in the last 8760 hours. HbA1C: No results for input(s): HGBA1C in the last 72 hours. CBG: No results for input(s): GLUCAP in the last 168 hours. Lipid Profile: No results for input(s): CHOL, HDL, LDLCALC, TRIG, CHOLHDL, LDLDIRECT in the last 72 hours. Thyroid Function Tests: No results for input(s): TSH, T4TOTAL, FREET4, T3FREE, THYROIDAB in the last 72 hours. Anemia Panel: No results for input(s):  VITAMINB12, FOLATE, FERRITIN, TIBC, IRON, RETICCTPCT in the last 72 hours. Sepsis Labs: No results for input(s): PROCALCITON, LATICACIDVEN in the last 168 hours.  Recent Results (from the past 240 hour(s))  Culture, Urine     Status: Abnormal   Collection Time: 05/05/17 12:50 PM  Result Value Ref Range Status   Specimen Description URINE, RANDOM  Final   Special Requests NONE  Final   Culture MULTIPLE SPECIES PRESENT, SUGGEST RECOLLECTION (A)  Final   Report Status 05/07/2017 FINAL  Final         Radiology Studies: Dg Abd 2 Views  Result Date: 05/07/2017 CLINICAL DATA:  61 year old female with history of abdominal distention. Evaluate for small bowel obstruction. EXAM: ABDOMEN - 2 VIEW COMPARISON:  Abdominal radiograph 05/06/2017. FINDINGS: Gas and stool are noted throughout the colon extending to the level of the distal rectum. No pathologic dilatation of small bowel is noted on today's examination. Air-fluid level is noted in a loop of small bowel on the upright projection. No pneumoperitoneum. Stoma seen projecting over the right lower quadrant. Multiple surgical clips projecting over the low anatomic pelvis and suture lines projecting  over the lower right abdomen. IMPRESSION: 1. Nonspecific, nonobstructive bowel gas pattern, as above. 2. No pneumoperitoneum. Electronically Signed   By: Vinnie Langton M.D.   On: 05/07/2017 10:18   Dg Abd Portable 2v  Result Date: 05/08/2017 CLINICAL DATA:  Patient admitted with a small bowel obstruction 3 days ago. EXAM: PORTABLE ABDOMEN - 2 VIEW COMPARISON:  CT abdomen and pelvis 05/05/2017. Plain films the abdomen 05/06/2017 and 05/07/2017. FINDINGS: The bowel gas pattern is normal. There is no evidence of free air. No radio-opaque calculi or other significant radiographic abnormality is seen. Convex right scoliosis and lower lumbar degenerative change noted. IMPRESSION: Negative exam. Electronically Signed   By: Inge Rise M.D.   On:  05/08/2017 17:24        Scheduled Meds: . amitriptyline  150 mg Oral QHS  . enoxaparin (LOVENOX) injection  40 mg Subcutaneous Q24H   Continuous Infusions: . sodium chloride 100 mL/hr at 05/09/17 0050     LOS: 4 days    Time spent: over 30 minutes    Fayrene Helper, MD [facility] Pager [phone]  If 7PM-7AM, please contact night-coverage [email or web address] Password [facility] 05/09/2017, 9:23 AM

## 2017-05-09 NOTE — Progress Notes (Signed)
CC: Small bowel obstruction  Subjective: Patient appears comfortable in bed but is extremely tender to any palpation.  She reported vomiting after full liquids yesterday film yesterday was unimpressive.  She actually had some dry heaves and really did not vomit any volume to speak of.  IV fluids are 100 mils an hour, I doubt the I/O is correct.  Abdomen is not distended, but very tender.  Objective: Vital signs in last 24 hours: Temp:  [98.2 F (36.8 C)-98.4 F (36.9 C)] 98.3 F (36.8 C) (11/26 0550) Pulse Rate:  [66-76] 74 (11/26 0550) Resp:  [16-18] 18 (11/26 0550) BP: (117-129)/(73-81) 119/73 (11/26 0550) SpO2:  [96 %-98 %] 96 % (11/26 0550) Last BM Date: 05/04/17 480 p.o. 13,300 IV recorded 1700 urine recorded Afebrile vital signs are stable Labs okay yesterday, none today. 2 view abdominal portable yesterday:  The bowel gas pattern is normal. There is no evidence of free air. Intake/Output from previous day: 11/25 0701 - 11/26 0700 In: 03500 [P.O.:480; I.V.:13300] Out: 1700 [Urine:1700] Intake/Output this shift: No intake/output data recorded.  General appearance: alert, cooperative and no distress Resp: clear to auscultation bilaterally and anterior exam GI: soft, no BS, scars OK not distended + flautus, one BM early yesterday.    Lab Results:  Recent Labs    05/07/17 0500 05/08/17 0500  WBC 6.2 6.9  HGB 10.7* 10.3*  HCT 31.4* 30.6*  PLT 207 191    BMET Recent Labs    05/07/17 0500 05/08/17 0500  NA 139 138  K 3.3* 4.2  CL 107 108  CO2 25 26  GLUCOSE 86 94  BUN 16 8  CREATININE 0.68 0.66  CALCIUM 7.9* 7.7*   PT/INR No results for input(s): LABPROT, INR in the last 72 hours.  Recent Labs  Lab 05/05/17 1430 05/06/17 0500 05/07/17 0500 05/08/17 0500  AST 21 252* 148* 66*  ALT 15 124* 138* 90*  ALKPHOS 62 105 158* 133*  BILITOT 1.0 1.1 0.8 0.5  PROT 6.2* 5.5* 5.5* 5.2*  ALBUMIN 3.5 3.2* 3.0* 2.8*     Lipase     Component Value  Date/Time   LIPASE 168 (H) 05/05/2017 1430     Prior to Admission medications   Medication Sig Start Date End Date Taking? Authorizing Provider  acetaminophen (TYLENOL) 500 MG tablet Take 1,000 mg by mouth every 8 (eight) hours as needed for mild pain.   Yes [provider]  albuterol (PROVENTIL HFA;VENTOLIN HFA) 108 (90 Base) MCG/ACT inhaler Inhale 2 puffs into the lungs every 6 (six) hours as needed.  07/31/15  Yes [provider]  amitriptyline (ELAVIL) 150 MG tablet Take 150 mg by mouth at bedtime. 06/26/13  Yes [provider]  amLODipine (NORVASC) 5 MG tablet Take 5 mg by mouth daily. 03/04/17  Yes [provider]  calcium carbonate (TUMS - DOSED IN MG ELEMENTAL CALCIUM) 500 MG chewable tablet Chew 1 tablet by mouth 3 (three) times daily with meals.   Yes [provider]  cetirizine (ZYRTEC) 10 MG tablet Take 10 mg by mouth at bedtime.   Yes [provider]  ciprofloxacin (CIPRO) 500 MG tablet Take 500 mg by mouth 2 (two) times daily. 04/18/17  Yes [provider]  clobetasol ointment (TEMOVATE) 9.38 % Apply 1 application topically 2 (two) times daily. 03/04/17  Yes [provider]  cyanocobalamin (,VITAMIN B-12,) 1000 MCG/ML injection Inject 1 mL into the muscle every 30 (thirty) days. 03/04/17  Yes [provider]  diclofenac sodium (VOLTAREN) 1 % GEL APP 4 GRAMS QID MAX 16 GRAMS PER JOINT PER DAY UP TO 32 GRAMS TOTAL PER DAY 12/17/15  Yes [provider]  diphenhydrAMINE (BENADRYL) 25 MG tablet Take 50 mg by mouth at bedtime.   Yes [provider]  estradiol (VIVELLE-DOT) 0.1 MG/24HR patch Place 1 patch onto the skin 2 (two) times a week.    Yes [provider]  ibuprofen (ADVIL,MOTRIN) 200 MG tablet Take 800 mg by mouth every 6 (six) hours as needed for mild pain.   Yes [provider]  meloxicam (MOBIC) 15 MG tablet Take 15 mg by mouth daily. 04/24/17  Yes [provider]  PREMARIN vaginal cream APP PEA-SIZED AMOUNT TO VULVA IN THE MORNING 4 TIMES A WEEK. 03/04/17  Yes [provider]    Medications: . amitriptyline  150 mg Oral QHS  . enoxaparin (LOVENOX) injection  40 mg Subcutaneous Q24H   . sodium chloride 100 mL/hr at 05/09/17 0050   Anti-infectives (From admission, onward)   Start     Dose/Rate Route Frequency Ordered Stop   05/05/17 1800  cefTRIAXone (ROCEPHIN) 1 g in dextrose 5 % 50 mL IVPB  Status:  Discontinued     1 g 100 mL/hr over 30 Minutes Intravenous Every 24 hours 05/05/17 1748 05/08/17 1534     Assessment/Plan Small bowel obstruction Hx abdominal hysterectomy with recurrent small bowel obstruction and lysis of adhesions. History of interstitial cystitis with cystectomy and ileal conduit  Elevated LFTs Possible UTI  Multiple sclerosis Chronic lymphocytic leukemia Hypertension  FEN: IV fluids/n.p.o. ID: Rocephin 1 g x1 dose. DVT: Lovenox Foley:  Urostomy  RUQ Follow-up: TBD    Plan:  Recheck film this AM, if still benign try her back on clears.   LOS: 4 days    Sheila Petersen 05/09/2017 [phone]

## 2017-05-09 NOTE — Care Management Note (Signed)
Case Management Note  Patient Details  Name: Sheila Petersen MRN: [id number] Date of Birth: 08/24/1955  Subjective/Objective:   61 yo admitted with SBO. Hx abdominal hysterectomy with recurrent small bowel obstruction and lysis of adhesions. History of interstitial cystitis with cystectomy and ileal conduit   Action/Plan: From home with spouse.  Expected Discharge Date:  05/07/17               Expected Discharge Plan:  Home/Self Care  In-House Referral:     Discharge planning Services  CM Consult  Post Acute Care Choice:    Choice offered to:     DME Arranged:    DME Agency:     HH Arranged:    HH Agency:     Status of Service:  In process, will continue to follow  If discussed at Long Length of Stay Meetings, dates discussed:    Additional CommentsLynnell Catalan, RN 05/09/2017, 10:59 AM  [phone]

## 2017-05-10 LAB — CBC
HCT: 29.2 % — ABNORMAL LOW (ref 36.0–46.0)
HEMOGLOBIN: 9.9 g/dL — AB (ref 12.0–15.0)
MCH: 29.9 pg (ref 26.0–34.0)
MCHC: 33.9 g/dL (ref 30.0–36.0)
MCV: 88.2 fL (ref 78.0–100.0)
Platelets: 180 10*3/uL (ref 150–400)
RBC: 3.31 MIL/uL — AB (ref 3.87–5.11)
RDW: 13.8 % (ref 11.5–15.5)
WBC: 5.7 10*3/uL (ref 4.0–10.5)

## 2017-05-10 LAB — COMPREHENSIVE METABOLIC PANEL
ALK PHOS: 96 U/L (ref 38–126)
ALT: 51 U/L (ref 14–54)
ANION GAP: 3 — AB (ref 5–15)
AST: 28 U/L (ref 15–41)
Albumin: 2.7 g/dL — ABNORMAL LOW (ref 3.5–5.0)
BILIRUBIN TOTAL: 0.6 mg/dL (ref 0.3–1.2)
BUN: <5 mg/dL — ABNORMAL LOW (ref 6–20)
CALCIUM: 7.9 mg/dL — AB (ref 8.9–10.3)
CO2: 28 mmol/L (ref 22–32)
CREATININE: 0.69 mg/dL (ref 0.44–1.00)
Chloride: 109 mmol/L (ref 101–111)
GFR calc non Af Amer: >60 mL/min (ref >60)
GLUCOSE: 91 mg/dL (ref 65–99)
Potassium: 3.4 mmol/L — ABNORMAL LOW (ref 3.5–5.1)
Sodium: 140 mmol/L (ref 135–145)
TOTAL PROTEIN: 4.9 g/dL — AB (ref 6.5–8.1)

## 2017-05-10 LAB — MAGNESIUM: Magnesium: 1.5 mg/dL — ABNORMAL LOW (ref 1.7–2.4)

## 2017-05-10 MED ORDER — MAGNESIUM SULFATE 4 GM/100ML IV SOLN
4.0000 g | Freq: Once | INTRAVENOUS | Status: AC
  Administered 2017-05-10: 4 g via INTRAVENOUS
  Filled 2017-05-10: qty 100

## 2017-05-10 MED ORDER — POTASSIUM CHLORIDE CRYS ER 20 MEQ PO TBCR
40.0000 meq | EXTENDED_RELEASE_TABLET | Freq: Once | ORAL | Status: AC
  Administered 2017-05-10: 40 meq via ORAL
  Filled 2017-05-10: qty 2

## 2017-05-10 MED ORDER — POTASSIUM CHLORIDE 20 MEQ PO PACK: 40.0000 meq | PACK | Freq: Once | ORAL | Status: DC

## 2017-05-10 NOTE — Progress Notes (Signed)
  Progress Note: General Surgery Service   Assessment/Plan: Patient Active Problem List   Diagnosis Date Noted  . Elevated liver enzymes 05/06/2017  . Urinary tract infection 05/06/2017  . SBO (small bowel obstruction) ([place]) 05/05/2017  . CLL (chronic lymphocytic leukemia) ([place]) 05/05/2017   Tolerating liquids, still with moderate diffuse pain, +flatus, no bm -advance to full liquids    LOS: 5 days  Chief Complaint/Subjective: Tolerating liquids, some nausea overnight  Objective: Vital signs in last 24 hours: Temp:  [98 F (36.7 C)-98.4 F (36.9 C)] 98.4 F (36.9 C) (11/27 0424) Pulse Rate:  [70-78] 70 (11/27 0424) Resp:  [18] 18 (11/27 0424) BP: (119-136)/(67-81) 119/68 (11/27 0424) SpO2:  [95 %-99 %] 99 % (11/27 0424) Last BM Date: 05/09/17  Intake/Output from previous day: 11/26 0701 - 11/27 0700 In: 3240 [P.O.:950; I.V.:2290] Out: 1870 [Urine:1870] Intake/Output this shift: No intake/output data recorded.  Lungs: CTAB  Cardiovascular: RRR  Abd: soft, NT, ND  Extremities: no edema  Neuro: AOx4  Lab Results: CBC  Recent Labs    05/09/17 1055 05/10/17 0500  WBC 5.5 5.7  HGB 10.7* 9.9*  HCT 32.0* 29.2*  PLT 201 180   BMET Recent Labs    05/09/17 1055 05/10/17 0500  NA 139 140  K 3.5 3.4*  CL 109 109  CO2 26 28  GLUCOSE 96 91  BUN 6 <5*  CREATININE 0.72 0.69  CALCIUM 7.9* 7.9*   PT/INR No results for input(s): LABPROT, INR in the last 72 hours. ABG No results for input(s): PHART, HCO3 in the last 72 hours.  Invalid input(s): PCO2, PO2  Studies/Results:  Anti-infectives: Anti-infectives (From admission, onward)   Start     Dose/Rate Route Frequency Ordered Stop   05/05/17 1800  cefTRIAXone (ROCEPHIN) 1 g in dextrose 5 % 50 mL IVPB  Status:  Discontinued     1 g 100 mL/hr over 30 Minutes Intravenous Every 24 hours 05/05/17 1748 05/08/17 1534      Medications: Scheduled Meds: . amitriptyline  150 mg Oral QHS  . enoxaparin  (LOVENOX) injection  40 mg Subcutaneous Q24H  . potassium chloride  40 mEq Oral Once   Continuous Infusions: . sodium chloride 100 mL/hr at 05/10/17 0519   PRN Meds:.acetaminophen, albuterol, diphenhydrAMINE, HYDROmorphone (DILAUDID) injection, ondansetron (ZOFRAN) IV  Mickeal Skinner, MD Pg# [phone] [facility]

## 2017-05-10 NOTE — Consult Note (Signed)
[facility] Nurse ostomy consult note Stoma type/location: RLQ, ileal conduit present for many years  Output yellow urine  Ostomy pouching:2pc. Convex urostomy pouch Patient using Convatec pouch, however she has only one in the room. She is concerned that she can not wear her belt right now and she may have to change more frequently  Provided patient with 2 2 1/4" barriers and 1 2 1/4" urostomy pouches in her room for use if needed. She is independent in her ostomy care.   Re consult if needed, will not follow at this time. Thanks  Damarcus Reggio [employer], RN,CWOCN, CNS, [place] [phone])

## 2017-05-10 NOTE — Progress Notes (Signed)
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  Assessment & Plan:   Active Problems:   SBO (small bowel obstruction) (HCC)   CLL (chronic lymphocytic leukemia) (HCC)   Elevated liver enzymes   Urinary tract infection   SBO (small bowel obstruction) (HCC) -Present on the CT scan, patient reports a long-standing history of recurrent small bowel obstructions requiring LOA in the past -initially improving, passing gas and BMs, her diet was advanced on 11/25 however patient developed significant abdominal distention, nausea and heaving after lunch - Appreciate surgery recs, CLD yesterday, will discuss with surgery plan for progressing diet at this point  Elevated liver enzymes -transient LFT elevation 11/23, improving since.  Etiology is unclear at this time.    -CT scan with no focal liver abnormality, status post cholecystectomy she did have CBD dilation up to 14 mm (dilated up to 10 mm on 09/01/15 CT in care everywhere) -monitor, she appears to have had a similar episode in January 2018 in the setting of an admission. She tested negative for hep B and C in the past (seen by previous provider, I'm unable to see these results).  CLL (chronic lymphocytic leukemia) ([place]) -Follow-up with oncology at [facility], she has Rituxin in the past per chart review in care everywhere  H/o cold agglutinin disease -Hemoglobin overall stable,  slightly lower likely delusional, LDH is normal at 174, bilirubin is normal suggesting against hemolysis  Possible UTI -ceftriaxone, s/p 3 days. Cultures with multiple species  HTN -hold amlodipine while NPO   DVT prophylaxis: lovenox Code Status: full  Family Communication: none at bedside Disposition Plan: pending improvement in symptoms   Consultants:   Surgery  Procedures: (Don't include imaging studies which can be auto populated. Include things that cannot be auto populated i.e. Echo, Carotid and venous dopplers, Foley, Bipap, HD, tubes/drains, wound vac, central lines etc)  none  Antimicrobials:  Anti-infectives (From admission, onward)   Start     Dose/Rate Route Frequency Ordered Stop   05/05/17 1800  cefTRIAXone (ROCEPHIN) 1 g in dextrose 5 % 50 mL IVPB  Status:  Discontinued     1 g 100 mL/hr over 30 Minutes Intravenous Every 24 hours 05/05/17 1748 05/08/17 1534       Subjective: Did ok with CLD yesterday. Some nausea and pain, but seems to have improved. No CP or SOB. Passing flatus, - BM.  Objective: Vitals:   05/09/17 0550 05/09/17 1413 05/09/17 2035 05/10/17 0424  BP: 119/73 136/81 132/67 119/68  Pulse: 74 72 78 70  Resp: 18 18 18 18   Temp: 98.3 F (36.8 C) 98.1 F (36.7 C) 98 F (36.7 C) 98.4 F (36.9 C)  TempSrc: Oral Oral Oral Oral  SpO2: 96% 95% 98% 99%  Weight:      Height:        Intake/Output Summary (Last 24 hours) at 05/10/2017 0959 Last data filed at 05/10/2017 0454 Gross per 24 hour  Intake 3240 ml  Output 1870 ml  Net 1370 ml   [employer]  05/05/17 1915  Weight: 74.8 kg (165 lb)    Examination:  General: No acute distress. Cardiovascular: Heart sounds show a regular rate, and rhythm. No gallops or rubs. No murmurs. No JVD. Lungs: Clear to auscultation bilaterally with good air movement. No rales, rhonchi or wheezes. Abdomen: Tender to lower quadrants. nondistended with normal active bowel sounds. No masses. No  hepatosplenomegaly.  Ileostomy. Neurological: Alert and oriented 3. Moves all extremities 4 with equal strength. Cranial nerves II through XII grossly intact. Skin: Warm and dry. No rashes or lesions. Extremities: No clubbing or cyanosis. No edema.  Psychiatric: Mood and affect are normal. Insight and judgment are appropriate.    Data Reviewed: I have personally reviewed following labs and imaging studies  CBC: Recent Labs  Lab 05/06/17 0500 05/07/17 0500 05/08/17 0500 05/09/17 1055 05/10/17 0500  WBC 9.1 6.2 6.9 5.5 5.7  HGB 11.6* 10.7* 10.3* 10.7* 9.9*  HCT 33.8* 31.4* 30.6* 32.0* 29.2*  MCV 89.4 92.9 86.9 89.1 88.2  PLT 226 207 191 201 101   Basic Metabolic Panel: Recent Labs  Lab 05/06/17 0500 05/07/17 0500 05/08/17 0500 05/09/17 1055 05/10/17 0500  NA 141 139 138 139 140  K 3.8 3.3* 4.2 3.5 3.4*  CL 106 107 108 109 109  CO2 28 25 26 26 28   GLUCOSE 100* 86 94 96 91  BUN 21* 16 8 6  <5*  CREATININE 0.79 0.68 0.66 0.72 0.69  CALCIUM 8.2* 7.9* 7.7* 7.9* 7.9*   GFR: Estimated Creatinine Clearance: 73.6 mL/min (by C-G formula based on SCr of 0.69 mg/dL). Liver Function Tests: Recent Labs  Lab 05/06/17 0500 05/07/17 0500 05/08/17 0500 05/09/17 1055 05/10/17 0500  AST 252* 148* 66* 35 28  ALT 124* 138* 90* 65* 51  ALKPHOS 105 158* 133* 112 96  BILITOT 1.1 0.8 0.5 0.4 0.6  PROT 5.5* 5.5* 5.2* 5.5* 4.9*  ALBUMIN 3.2* 3.0* 2.8* 3.1* 2.7*   Recent Labs  Lab 05/05/17 1430  LIPASE 168*   No results for input(s): AMMONIA in the last 168 hours. Coagulation Profile: No results for input(s): INR, PROTIME in the last 168 hours. Cardiac Enzymes: No results for input(s): CKTOTAL, CKMB, CKMBINDEX, TROPONINI in the last 168 hours. BNP (last 3 results) No results for input(s): PROBNP in the last 8760 hours. HbA1C: No results for input(s): HGBA1C in the last 72 hours. CBG: No results for input(s): GLUCAP in the last 168 hours. Lipid Profile: No results for  input(s): CHOL, HDL, LDLCALC, TRIG, CHOLHDL, LDLDIRECT in the last 72 hours. Thyroid Function Tests: No results for input(s): TSH, T4TOTAL, FREET4, T3FREE, THYROIDAB in the last 72 hours. Anemia Panel: No results for input(s): VITAMINB12, FOLATE, FERRITIN, TIBC, IRON, RETICCTPCT in the last 72 hours. Sepsis Labs: No results for input(s): PROCALCITON, LATICACIDVEN in the last 168 hours.  Recent Results (from the past 240 hour(s))  Culture, Urine     Status: Abnormal   Collection Time: 05/05/17 12:50 PM  Result Value Ref Range Status   Specimen Description URINE, RANDOM  Final   Special Requests NONE  Final   Culture MULTIPLE SPECIES PRESENT, SUGGEST RECOLLECTION (A)  Final   Report Status 05/07/2017 FINAL  Final         Radiology Studies: Dg Abd 1 View  Result Date: 05/09/2017 CLINICAL DATA:  Follow-up small bowel obstruction EXAM: ABDOMEN - 1 VIEW COMPARISON:  05/08/2017 FINDINGS: Gas and moderate stool throughout the colon. Single mildly prominent left abdominal small bowel loop noted. No free air organomegaly. Extensive clips  in the pelvis. Bowel suture line in the right lower quadrant with right lower quadrant ostomy noted. IMPRESSION: Single prominent left abdominal small bowel loop, nonspecific. This could reflect focal ileus. Cannot exclude early or low grade small bowel obstruction. Electronically Signed   By: Rolm Baptise M.D.   On: 05/09/2017 10:58   Dg Abd Portable 2v  Result Date: 05/08/2017 CLINICAL DATA:  Patient admitted with a small bowel obstruction 3 days ago. EXAM: PORTABLE ABDOMEN - 2 VIEW COMPARISON:  CT abdomen and pelvis 05/05/2017. Plain films the abdomen 05/06/2017 and 05/07/2017. FINDINGS: The bowel gas pattern is normal. There is no evidence of free air. No radio-opaque calculi or other significant radiographic abnormality is seen. Convex right scoliosis and lower lumbar degenerative change noted. IMPRESSION: Negative exam. Electronically Signed   By: Inge Rise M.D.   On: 05/08/2017 17:24        Scheduled Meds: . amitriptyline  150 mg Oral QHS  . enoxaparin (LOVENOX) injection  40 mg Subcutaneous Q24H  . potassium chloride  40 mEq Oral Once   Continuous Infusions: . sodium chloride 100 mL/hr at 05/10/17 0519     LOS: 5 days    Time spent: over 20 minutes    Fayrene Helper, MD [facility] Pager [phone]  If 7PM-7AM, please contact night-coverage [email or web address] Password [email or web address] 05/10/2017, 9:59 AM

## 2017-05-11 LAB — CBC
HEMATOCRIT: 29.6 % — AB (ref 36.0–46.0)
Hemoglobin: 10 g/dL — ABNORMAL LOW (ref 12.0–15.0)
MCH: 29.8 pg (ref 26.0–34.0)
MCHC: 33.8 g/dL (ref 30.0–36.0)
MCV: 88.1 fL (ref 78.0–100.0)
Platelets: 188 10*3/uL (ref 150–400)
RBC: 3.36 MIL/uL — AB (ref 3.87–5.11)
RDW: 13.9 % (ref 11.5–15.5)
WBC: 5.3 10*3/uL (ref 4.0–10.5)

## 2017-05-11 LAB — COMPREHENSIVE METABOLIC PANEL
ALBUMIN: 2.8 g/dL — AB (ref 3.5–5.0)
ALT: 41 U/L (ref 14–54)
AST: 23 U/L (ref 15–41)
Alkaline Phosphatase: 92 U/L (ref 38–126)
Anion gap: 4 — ABNORMAL LOW (ref 5–15)
BILIRUBIN TOTAL: 0.5 mg/dL (ref 0.3–1.2)
CHLORIDE: 108 mmol/L (ref 101–111)
CO2: 28 mmol/L (ref 22–32)
Calcium: 8 mg/dL — ABNORMAL LOW (ref 8.9–10.3)
Creatinine, Ser: 0.73 mg/dL (ref 0.44–1.00)
GFR calc Af Amer: >60 mL/min (ref >60)
GFR calc non Af Amer: >60 mL/min (ref >60)
GLUCOSE: 100 mg/dL — AB (ref 65–99)
POTASSIUM: 3.8 mmol/L (ref 3.5–5.1)
SODIUM: 140 mmol/L (ref 135–145)
TOTAL PROTEIN: 5 g/dL — AB (ref 6.5–8.1)

## 2017-05-11 MED ORDER — ONDANSETRON 4 MG PO TBDP: 4.0000 mg | ORAL_TABLET | Freq: Four times a day (QID) | ORAL | 0 refills | Status: AC | PRN

## 2017-05-11 MED ORDER — HEPARIN SOD (PORK) LOCK FLUSH 100 UNIT/ML IV SOLN
500.0000 [IU] | Freq: Once | INTRAVENOUS | Status: DC
  Filled 2017-05-11: qty 5

## 2017-05-11 NOTE — Progress Notes (Signed)
  Progress Note: General Surgery Service   Assessment/Plan: Patient Active Problem List   Diagnosis Date Noted  . Elevated liver enzymes 05/06/2017  . Urinary tract infection 05/06/2017  . SBO (small bowel obstruction) ([place]) 05/05/2017  . CLL (chronic lymphocytic leukemia) ([place]) 05/05/2017   -tolerating full liquids -advance to soft -ok to discharge   LOS: 6 days  Chief Complaint/Subjective: Pain much improved, several BMs overnight, tolerated liquids  Objective: Vital signs in last 24 hours: Temp:  [98 F (36.7 C)-98.4 F (36.9 C)] 98.4 F (36.9 C) (11/28 0508) Pulse Rate:  [66-73] 73 (11/28 0508) Resp:  [16-18] 18 (11/28 0508) BP: (124-140)/(63-72) 124/63 (11/28 0508) SpO2:  [98 %-99 %] 99 % (11/28 0508) Last BM Date: 05/09/17  Intake/Output from previous day: 11/27 0701 - 11/28 0700 In: 360 [P.O.:360] Out: 370 [Urine:370] Intake/Output this shift: No intake/output data recorded.  Lungs: CTAB  Cardiovascular: RRR  Abd: soft, slight pain on palpation, no distension, urostomy patent  Extremities: no edema  Neuro: AOx4  Lab Results: CBC  Recent Labs    05/10/17 0500 05/11/17 0620  WBC 5.7 5.3  HGB 9.9* 10.0*  HCT 29.2* 29.6*  PLT 180 188   BMET Recent Labs    05/10/17 0500 05/11/17 0620  NA 140 140  K 3.4* 3.8  CL 109 108  CO2 28 28  GLUCOSE 91 100*  BUN <5* <5*  CREATININE 0.69 0.73  CALCIUM 7.9* 8.0*   PT/INR No results for input(s): LABPROT, INR in the last 72 hours. ABG No results for input(s): PHART, HCO3 in the last 72 hours.  Invalid input(s): PCO2, PO2  Studies/Results:  Anti-infectives: Anti-infectives (From admission, onward)   Start     Dose/Rate Route Frequency Ordered Stop   05/05/17 1800  cefTRIAXone (ROCEPHIN) 1 g in dextrose 5 % 50 mL IVPB  Status:  Discontinued     1 g 100 mL/hr over 30 Minutes Intravenous Every 24 hours 05/05/17 1748 05/08/17 1534      Medications: Scheduled Meds: . amitriptyline  150 mg Oral  QHS  . enoxaparin (LOVENOX) injection  40 mg Subcutaneous Q24H   Continuous Infusions: . sodium chloride 100 mL/hr at 05/11/17 0507   PRN Meds:.acetaminophen, albuterol, diphenhydrAMINE, HYDROmorphone (DILAUDID) injection, ondansetron (ZOFRAN) IV  Mickeal Skinner, MD Pg# [phone] [facility]

## 2017-05-11 NOTE — Discharge Summary (Signed)
Physician Discharge Summary  Sheila Petersen YHC:[id number] DOB: March 25, 1956 DOA: 05/05/2017  PCP: System, Pcp Not In  Admit date: 05/05/2017 Discharge date: 05/11/2017  Time spent: over 30 minutes  Recommendations for Outpatient Follow-up:  1. Follow up outpatient CBC/CMP 2. Follow up with PCP and surgeons as outpatient 3. 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  Hospital Course:  SBO (small bowel obstruction) (HCC) -Present on the CT scan, 61 patient reports [name] long-standing history of recurrent small bowel obstructions requiring LOA in the past -initially improving, passing gas and BMs, her diet was advanced on 11/25 however patient developed significant abdominal distention, nausea and heaving after lunch - Today improved, passing BM.  Will discharge on soft diet.   - recommended follow up with PCP and outpatient surgical providers  Elevated liver enzymes -transient LFT elevation 11/23,improving since.  Etiology is unclear at this time.    -CT scan with no focal liver abnormality, status post cholecystectomy she did have CBD dilation up to 62 mm  (dilated up to 10 mm on 09/01/15 CT in care everywhere) -monitor, she appears to have had Caige Almeda similar episode in January 2018 in the setting of an admission.She tested negative for hep B and C in the past (seen by previous provider, I'm unable to see these results). - recommended outpatient follow up  CLL (chronic lymphocytic leukemia) ([place]) -Follow-up with oncology at [facility], she has Rituxin in the past per chart review in care everywhere  H/o cold agglutinin disease -Hemoglobin overall stable, slightly lower likely delusional, LDH is normal at 174, bilirubin is normal suggesting against hemolysis  Possible UTI -ceftriaxone,s/p 3 days. Cultures with multiple species  HTN - amlodipine held while inpatient   Procedures:  none (i.e. Studies not automatically included, echos, thoracentesis, etc; not x-rays)  Consultations:  surgery  Discharge Exam: Vitals:   05/10/17 2128 05/11/17 0508  BP: 140/72 124/63  Pulse: 66 73  Resp: 18 18  Temp: 98 F (36.7 C) 98.4 F (36.9 C)  SpO2: 98% 99%   Feeling better.  Would like to go home.  Passing gas, having bowel movements.  General: No acute distress. Cardiovascular: Heart sounds show Malaak Stach regular rate, and rhythm. No gallops or rubs. No murmurs. No JVD. Lungs: Clear to auscultation bilaterally with good air movement. No rales, rhonchi or wheezes. Abdomen: Soft, tender to lower quadrants, but improved from previous exams.  Urostomy in place.  Neurological: Alert and oriented 3. Moves all extremities 4 with equal strength. Cranial nerves II through XII grossly intact. Skin: Warm and dry. No rashes or lesions. Extremities: No clubbing or cyanosis. No edema. Pedal pulses 2+. Psychiatric: Mood and affect are normal. Insight and judgment are appropriate.  Discharge Instructions   Discharge Instructions  Call MD for:  difficulty breathing, headache or visual disturbances   Complete by:  As directed    Call MD for:  persistant  dizziness or light-headedness   Complete by:  As directed    Call MD for:  persistant nausea and vomiting   Complete by:  As directed    Call MD for:  severe uncontrolled pain   Complete by:  As directed    Call MD for:  temperature >100.4   Complete by:  As directed    Discharge diet:   Complete by:  As directed    Soft diet   Discharge instructions   Complete by:  As directed    You were seen for Sheila Petersen small bowel obstruction.  You have improved with conservative care.  Please follow up with your new PCP within the next week.  Follow up with your outpatient surgeons as well.  Continue Paullette Mckain soft diet for now.  If you tolerate this well, you can continue to advance your diet slowly over the next several days.  Your labs also showed elevated liver enzymes and dilation in your common bile duct [name] little more than previous.  The liver enzymes improved.  Please follow up with your PCP regarding this as well, you may need to see the GI doctors.  Return if you have new, worsening, or recurrent symptoms.  Please have your PCP request your records from this hospitalization so they know what has been done and the next steps of care.   Increase activity slowly   Complete by:  As directed      Discharge Medication List as of 05/11/2017  2:45 PM    START taking these medications   Details  ondansetron (ZOFRAN-ODT) 4 MG disintegrating tablet Take 1 tablet (4 mg total) by mouth every 6 (six) hours as needed for up to 20 doses for nausea or vomiting (Place under tongue)., Starting Wed 05/11/2017, Normal      CONTINUE these medications which have NOT CHANGED   Details  acetaminophen (TYLENOL) 500 MG tablet Take 1,000 mg by mouth every 8 (eight) hours as needed for mild pain., Historical Med    albuterol (PROVENTIL HFA;VENTOLIN HFA) 108 (90 Base) MCG/ACT inhaler Inhale 2 puffs into the lungs every 6 (six) hours as needed. , Starting Thu 07/31/2015, Historical Med    amitriptyline (ELAVIL) 150 MG tablet Take 150  mg by mouth at bedtime., Starting Tue 06/26/2013, Historical Med    amLODipine (NORVASC) 5 MG tablet Take 5 mg by mouth daily., Starting Fri 03/04/2017, Historical Med    calcium carbonate (TUMS - DOSED IN MG ELEMENTAL CALCIUM) 500 MG chewable tablet Chew 1 tablet by mouth 3 (three) times daily with meals., Historical Med    cetirizine (ZYRTEC) 10 MG tablet Take 10 mg by mouth at bedtime., Historical Med    clobetasol ointment (TEMOVATE) 3.55 % Apply 1 application topically 2 (two) times daily., Starting Fri 03/04/2017, Historical Med    cyanocobalamin (,VITAMIN B-12,) 1000 MCG/ML injection Inject 1 mL into the muscle every 30 (thirty) days., Starting Fri 03/04/2017, Historical Med    diclofenac sodium (VOLTAREN) 1 % GEL APP 4 GRAMS QID MAX 16 GRAMS PER JOINT PER DAY UP TO 32 GRAMS TOTAL PER DAY, Historical Med    diphenhydrAMINE (BENADRYL) 25 MG tablet Take 50 mg by mouth at bedtime., Historical Med    estradiol (VIVELLE-DOT) 0.1 MG/24HR patch Place 1 patch onto the skin 2 (two) times Quentin Shorey week. , Historical Med  meloxicam (MOBIC) 15 MG tablet Take 15 mg by mouth daily., Starting Sun 04/24/2017, Historical Med    PREMARIN vaginal cream APP PEA-SIZED AMOUNT TO VULVA IN THE MORNING 4 TIMES Eladio Dentremont WEEK., Historical Med      STOP taking these medications     ciprofloxacin (CIPRO) 500 MG tablet      ibuprofen (ADVIL,MOTRIN) 200 MG tablet        Allergies  Allergen Reactions  . Ketamine Hives and Other (See Comments)    hallucination    . Monosodium Glutamate Anaphylaxis  . Allopurinol Hives  . Epinephrine Other (See Comments)    Blood pressure drop.  . Latex     anaphylaxis  . Metronidazole Other (See Comments)    Rectal bleeding, pelvic pain.   Marland Kitchen Morphine And Related     heartburn  . Penicillins Rash    Has patient had Chevon Laufer PCN reaction causing immediate rash, facial/tongue/throat swelling, SOB or lightheadedness with hypotension: No Has patient had Miyah Hampshire PCN reaction causing severe  rash involving mucus membranes or skin necrosis: No Has patient had Sophee Mckimmy PCN reaction that required hospitalization: No Has patient had Dravyn Severs PCN reaction occurring within the last 10 years: No If all of the above answers are "NO", then may proceed with Cephalosporin use.       The results of significant diagnostics from this hospitalization (including imaging, microbiology, ancillary and laboratory) are listed below for reference.    Significant Diagnostic Studies: Dg Abd 1 View  Result Date: 05/09/2017 CLINICAL DATA:  Follow-up small bowel obstruction EXAM: ABDOMEN - 1 VIEW COMPARISON:  05/08/2017 FINDINGS: Gas and moderate stool throughout the colon. Single mildly prominent left abdominal small bowel loop noted. No free air organomegaly. Extensive clips in the pelvis. Bowel suture line in the right lower quadrant with right lower quadrant ostomy noted. IMPRESSION: Single prominent left abdominal small bowel loop, nonspecific. This could reflect focal ileus. Cannot exclude early or low grade small bowel obstruction. Electronically Signed   By: Rolm Baptise M.D.   On: 05/09/2017 10:58   Dg Abd 1 View  Result Date: 05/06/2017 CLINICAL DATA:  Follow up small bowel obstruction EXAM: ABDOMEN - 1 VIEW COMPARISON:  05/05/2017 FINDINGS: Scattered large and small bowel gas is noted. Adiyah Lame right lower quadrant ostomy is noted. Contrast material is noted within the collecting systems from recent CT. Janith Nielson single loop of prominent small bowel is noted in the mid abdomen. No definitive free air is noted. Degenerative changes of the lumbar spine are noted. IMPRESSION: Single dilated loop of small bowel in the mid abdomen. The overall appearance is improved slightly from the prior exam. Correlation with the physical exam is recommended. Electronically Signed   By: Inez Catalina M.D.   On: 05/06/2017 07:27   Ct Abdomen Pelvis W Contrast  Result Date: 05/05/2017 CLINICAL DATA:  Abdominal distention. Nausea and vomiting.  Bowel obstruction. EXAM: CT ABDOMEN AND PELVIS WITH CONTRAST TECHNIQUE: Multidetector CT imaging of the abdomen and pelvis was performed using the standard protocol following bolus administration of intravenous contrast. CONTRAST:  100 cc Isovue-300 intravenous COMPARISON:  Abdominal CT report [facility] 01/19/2016. FINDINGS: Lower chest:  No acute finding Hepatobiliary: No focal liver abnormality.Cholecystectomy. Prominent dilatation of the common bile duct measuring up to 14 mm. No calcified choledocholithiasis or mass. Pancreas: Borderline distal pancreatic duct size at 3 mm. No inflammation or masslike finding. Spleen: Unremarkable. Adrenals/Urinary Tract: Negative adrenals. Mild pelviectasis. Gas is seen within the right lower ureter. Status post cystectomy  with urinary conduit. Conduit is collapsed currently. Hypoenhancement in the lower pole left kidney is associated with volume loss and best attributed to scarring. No convincing pyelonephritis. Stomach/Bowel: There is dilated small bowel with fluid filling and fecalized material before an abrupt transition in the ventral abdomen just below the umbilicus. No hernia or mass is seen at this level. Findings are likely postoperative adhesions. Enteroenterostomies are noted elsewhere. Vascular/Lymphatic: No acute vascular abnormality. Pelvic lymphadenectomy. No noted mass or adenopathy. Reproductive:Hysterectomy. Other: No ascites or pneumoperitoneum. Musculoskeletal: No acute abnormalities. Notable for advanced L4-5 and L2-3 disc degeneration with endplate sclerosis and irregularity. IMPRESSION: 1. High-grade small bowel obstruction with transition point in the ventral midline abdomen, likely adhesive disease. 2. Cystectomy with ileal conduit.  No unexpected finding. 3. Other areas of enteroenterostomy. 4. Cholecystectomy with dilated common bile duct (14 mm). Please correlate with liver function tests. Electronically Signed   By: Monte Fantasia M.D.   On:  05/05/2017 17:39   Dg Abd 2 Views  Result Date: 05/07/2017 CLINICAL DATA:  61 year old female with history of abdominal distention. Evaluate for small bowel obstruction. EXAM: ABDOMEN - 2 VIEW COMPARISON:  Abdominal radiograph 05/06/2017. FINDINGS: Gas and stool are noted throughout the colon extending to the level of the distal rectum. No pathologic dilatation of small bowel is noted on today's examination. Air-fluid level is noted in Selda Jalbert loop of small bowel on the upright projection. No pneumoperitoneum. Stoma seen projecting over the right lower quadrant. Multiple surgical clips projecting over the low anatomic pelvis and suture lines projecting over the lower right abdomen. IMPRESSION: 1. Nonspecific, nonobstructive bowel gas pattern, as above. 2. No pneumoperitoneum. Electronically Signed   By: Vinnie Langton M.D.   On: 05/07/2017 10:18   Dg Abd Acute W/chest  Result Date: 05/05/2017 CLINICAL DATA:  Right-sided abdominal pain with nausea and vomiting. Check port placement. EXAM: DG ABDOMEN ACUTE W/ 1V CHEST COMPARISON:  None. FINDINGS: There is Tamie Minteer single moderately dilated loop of small bowel containing an air-fluid level in the left abdomen. Paucity of remaining small bowel gas. Large colonic stool burden. Bowel anastomotic sutures are noted in the right lower quadrant. Multiple surgical clips in the pelvis. Degenerative disc disease at L4-L5. No acute osseous abnormality. Right chest wall port catheter with the tip at the cavoatrial junction. Heart size and mediastinal contours are within normal limits. Both lungs are clear. IMPRESSION: 1. Single moderately dilated loop of small bowel containing an air-fluid level in the left abdomen. While nonspecific, given the paucity of small bowel gas, findings could represent partial or early small bowel obstruction. 2.  Prominent stool throughout the colon favors constipation. 3. Appropriately positioned right chest wall port catheter. No active  cardiopulmonary disease. Electronically Signed   By: Titus Dubin M.D.   On: 05/05/2017 12:23   Dg Abd Portable 2v  Result Date: 05/08/2017 CLINICAL DATA:  Patient admitted with Jniyah Dantuono small bowel obstruction 3 days ago. EXAM: PORTABLE ABDOMEN - 2 VIEW COMPARISON:  CT abdomen and pelvis 05/05/2017. Plain films the abdomen 05/06/2017 and 05/07/2017. FINDINGS: The bowel gas pattern is normal. There is no evidence of free air. No radio-opaque calculi or other significant radiographic abnormality is seen. Convex right scoliosis and lower lumbar degenerative change noted. IMPRESSION: Negative exam. Electronically Signed   By: Inge Rise M.D.   On: 05/08/2017 17:24    Microbiology: Recent Results (from the past 240 hour(s))  Culture, Urine     Status: Abnormal   Collection Time: 05/05/17 12:50 PM  Result Value Ref Range Status   Specimen Description URINE, RANDOM  Final   Special Requests NONE  Final   Culture MULTIPLE SPECIES PRESENT, SUGGEST RECOLLECTION (Justa Hatchell)  Final   Report Status 05/07/2017 FINAL  Final     Labs: Basic Metabolic Panel: Recent Labs  Lab 05/07/17 0500 05/08/17 0500 05/09/17 1055 05/10/17 0500 05/11/17 0620  NA 139 138 139 140 140  K 3.3* 4.2 3.5 3.4* 3.8  CL 107 108 109 109 108  CO2 25 26 26 28 28   GLUCOSE 86 94 96 91 100*  BUN 16 8 6  <5* <5*  CREATININE 0.68 0.66 0.72 0.69 0.73  CALCIUM 7.9* 7.7* 7.9* 7.9* 8.0*  MG  --   --   --  1.5*  --    Liver Function Tests: Recent Labs  Lab 05/07/17 0500 05/08/17 0500 05/09/17 1055 05/10/17 0500 05/11/17 0620  AST 148* 66* 35 28 23  ALT 138* 90* 65* 51 41  ALKPHOS 158* 133* 112 96 92  BILITOT 0.8 0.5 0.4 0.6 0.5  PROT 5.5* 5.2* 5.5* 4.9* 5.0*  ALBUMIN 3.0* 2.8* 3.1* 2.7* 2.8*   Recent Labs  Lab 05/05/17 1430  LIPASE 168*   No results for input(s): AMMONIA in the last 168 hours. CBC: Recent Labs  Lab 05/07/17 0500 05/08/17 0500 05/09/17 1055 05/10/17 0500 05/11/17 0620  WBC 6.2 6.9 5.5 5.7 5.3   HGB 10.7* 10.3* 10.7* 9.9* 10.0*  HCT 31.4* 30.6* 32.0* 29.2* 29.6*  MCV 92.9 86.9 89.1 88.2 88.1  PLT 207 191 201 180 188   Cardiac Enzymes: No results for input(s): CKTOTAL, CKMB, CKMBINDEX, TROPONINI in the last 168 hours. BNP: BNP (last 3 results) No results for input(s): BNP in the last 8760 hours.  ProBNP (last 3 results) No results for input(s): PROBNP in the last 8760 hours.  CBG: No results for input(s): GLUCAP in the last 168 hours.     Signed:  Fayrene Helper MD.  [facility] 05/11/2017, 10:18 PM

## 2018-06-29 IMAGING — DX DG ABDOMEN 2V
2 series · 2 of 2 positions shown · non-contrast
Comparison: Abdominal radiograph 05/06/2017.

CLINICAL DATA: 61-year-old female with history of abdominal
distention. Evaluate for small bowel obstruction.

EXAM:
ABDOMEN - 2 VIEW

[abdomen erect]
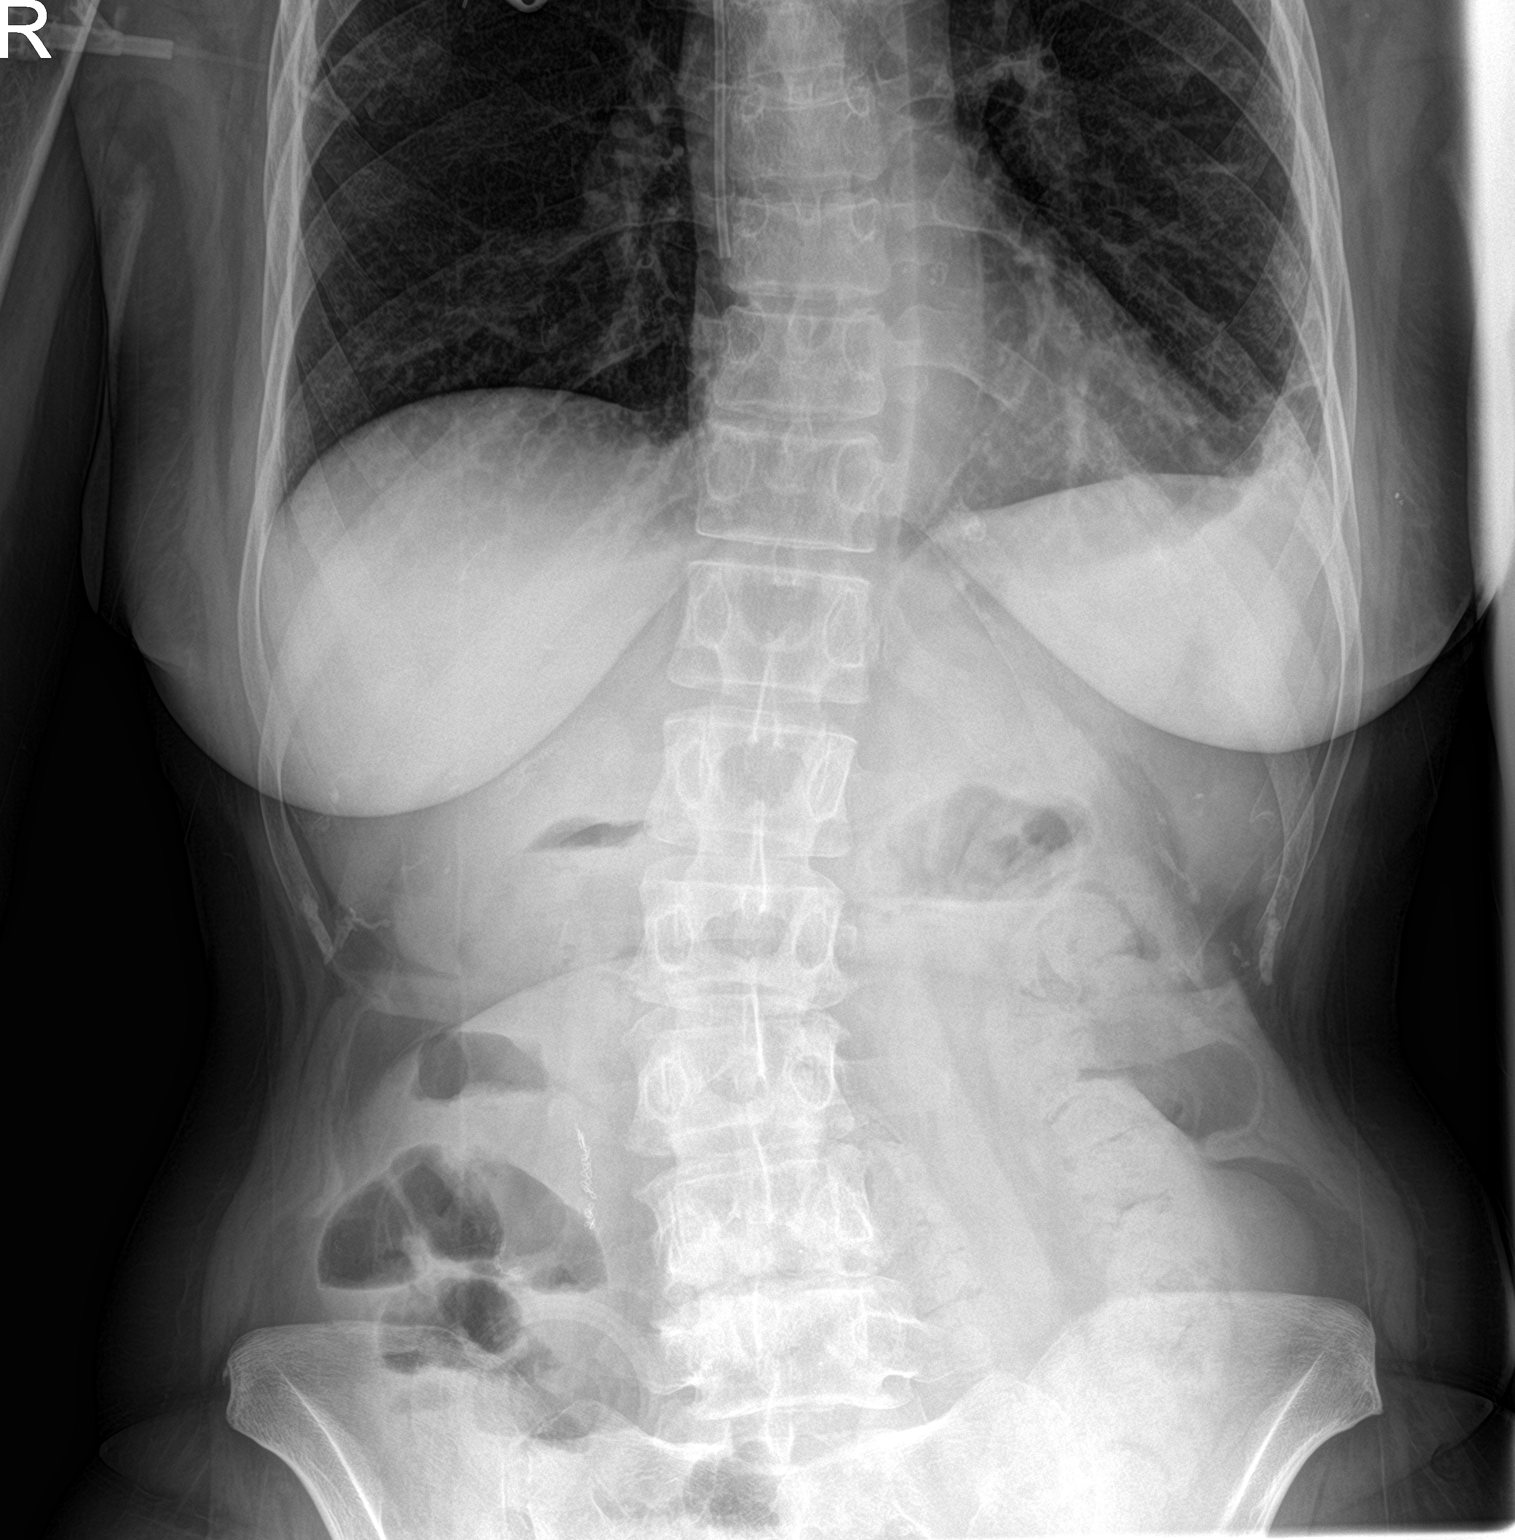

[abdomen supine]
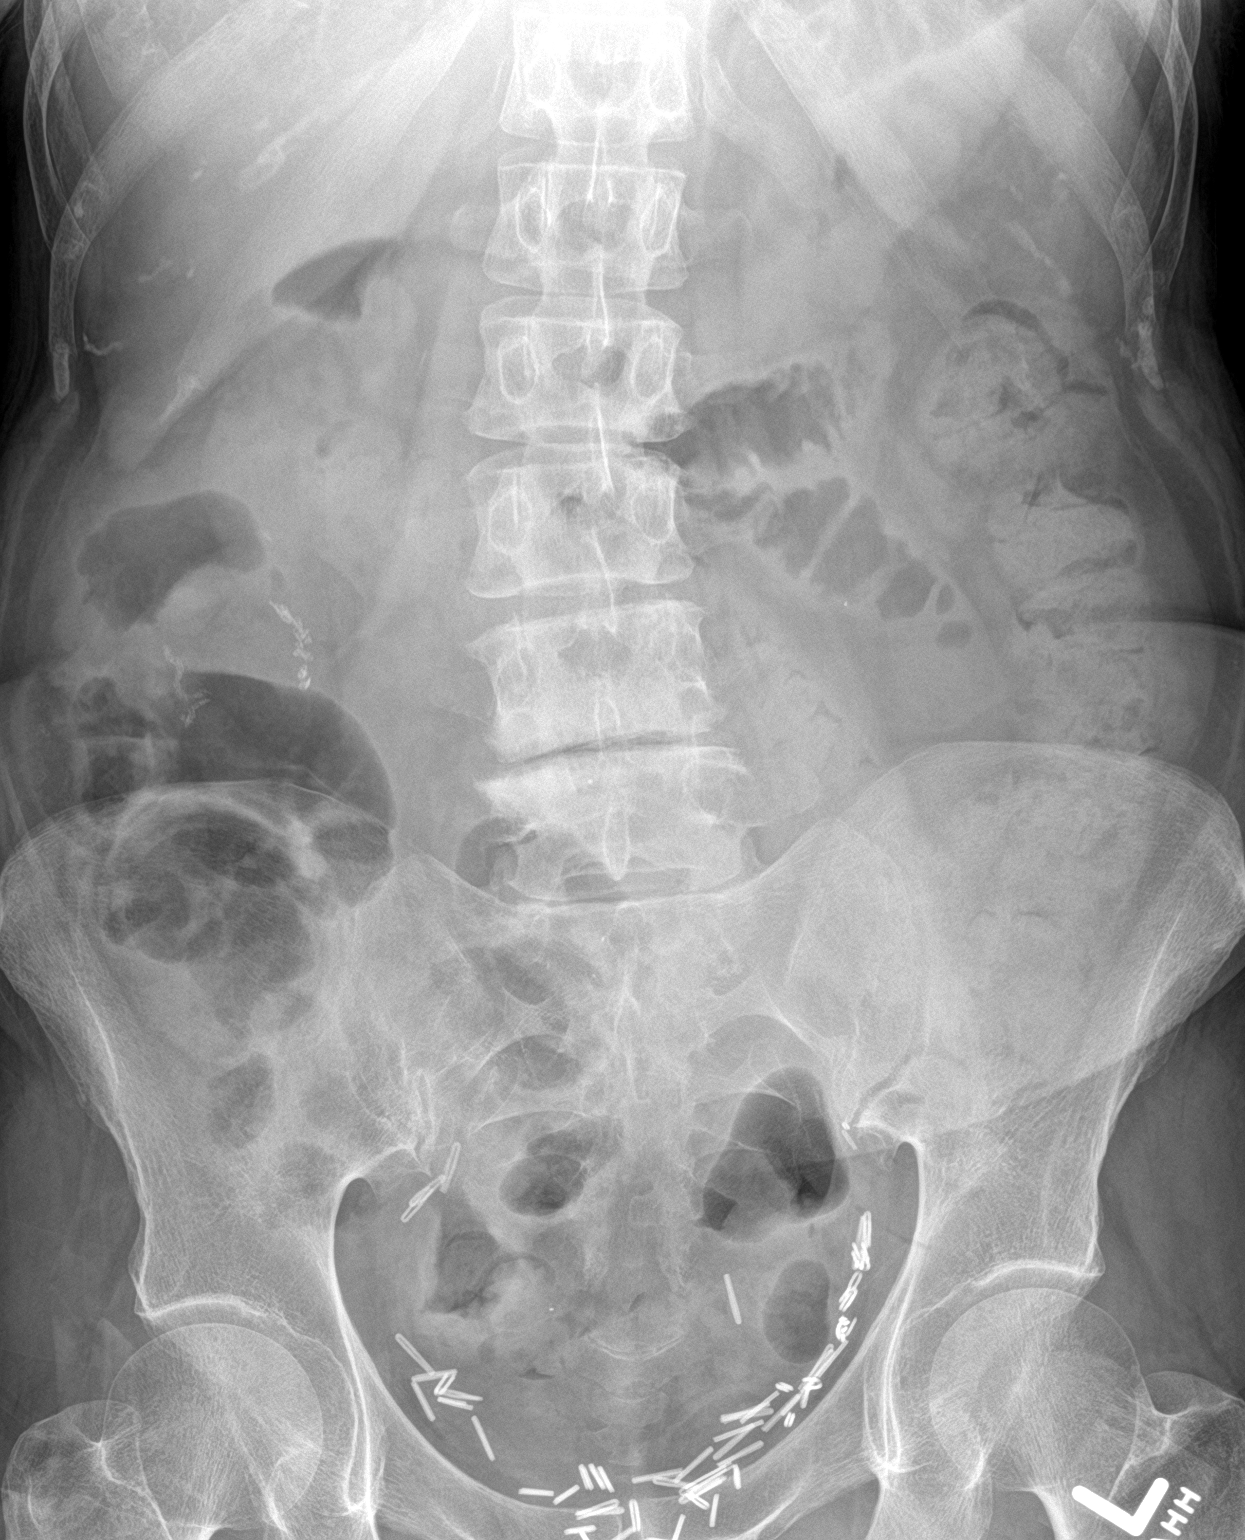

[2 of 2 positions shown; findings below may reference images not displayed]

FINDINGS: Gas and stool are noted throughout the colon extending to the level
of the distal rectum. No pathologic dilatation of small bowel is
noted on today's examination. Air-fluid level is noted in a loop of
small bowel on the upright projection. No pneumoperitoneum. Stoma
seen projecting over the right lower quadrant. Multiple surgical
clips projecting over the low anatomic pelvis and suture lines
projecting over the lower right abdomen.
IMPRESSION: 1. Nonspecific, nonobstructive bowel gas pattern, as above.
2. No pneumoperitoneum.
